# Patient Record
Sex: Female | Born: 1950
Health system: Southern US, Community
[De-identification: ages and names within clinical notes are randomized; demographics above are authoritative.]

## PROBLEM LIST (undated history)

## (undated) DIAGNOSIS — IMO0001 Reserved for inherently not codable concepts without codable children: Secondary | ICD-10-CM

## (undated) DIAGNOSIS — J302 Other seasonal allergic rhinitis: Secondary | ICD-10-CM

## (undated) DIAGNOSIS — F419 Anxiety disorder, unspecified: Secondary | ICD-10-CM

## (undated) DIAGNOSIS — G629 Polyneuropathy, unspecified: Secondary | ICD-10-CM

## (undated) DIAGNOSIS — E119 Type 2 diabetes mellitus without complications: Secondary | ICD-10-CM

## (undated) DIAGNOSIS — Z8619 Personal history of other infectious and parasitic diseases: Secondary | ICD-10-CM

## (undated) DIAGNOSIS — L729 Follicular cyst of the skin and subcutaneous tissue, unspecified: Secondary | ICD-10-CM

## (undated) DIAGNOSIS — I10 Essential (primary) hypertension: Secondary | ICD-10-CM

## (undated) DIAGNOSIS — Z794 Long term (current) use of insulin: Secondary | ICD-10-CM

## (undated) DIAGNOSIS — N051 Unspecified nephritic syndrome with focal and segmental glomerular lesions: Secondary | ICD-10-CM

## (undated) DIAGNOSIS — Z98811 Dental restoration status: Secondary | ICD-10-CM

## (undated) DIAGNOSIS — M1990 Unspecified osteoarthritis, unspecified site: Secondary | ICD-10-CM

## (undated) DIAGNOSIS — K219 Gastro-esophageal reflux disease without esophagitis: Secondary | ICD-10-CM

## (undated) DIAGNOSIS — Z8669 Personal history of other diseases of the nervous system and sense organs: Secondary | ICD-10-CM

## (undated) HISTORY — DX: Anxiety disorder, unspecified: F41.9

## (undated) HISTORY — DX: Other seasonal allergic rhinitis: J30.2

## (undated) HISTORY — PX: FOOT SURGERY: SHX648

## (undated) HISTORY — PX: NASAL SEPTUM SURGERY: SHX37

## (undated) HISTORY — DX: Gastro-esophageal reflux disease without esophagitis: K21.9

## (undated) HISTORY — PX: CARPAL TUNNEL RELEASE: SHX101

## (undated) HISTORY — DX: Polyneuropathy, unspecified: G62.9

## (undated) HISTORY — PX: TONSILLECTOMY: SUR1361

## (undated) HISTORY — DX: Unspecified nephritic syndrome with focal and segmental glomerular lesions: N05.1

## (undated) HISTORY — PX: ABDOMINAL HYSTERECTOMY: SHX81

---

## 1999-05-02 ENCOUNTER — Other Ambulatory Visit: Admission: RE | Admit: 1999-05-02 | Discharge: 1999-05-02 | Payer: Self-pay | Admitting: *Deleted

## 1999-06-07 ENCOUNTER — Encounter: Payer: Self-pay | Admitting: *Deleted

## 1999-06-08 ENCOUNTER — Inpatient Hospital Stay (HOSPITAL_COMMUNITY): Admission: RE | Admit: 1999-06-08 | Discharge: 1999-06-10 | Payer: Self-pay | Admitting: *Deleted

## 1999-06-08 ENCOUNTER — Encounter (INDEPENDENT_AMBULATORY_CARE_PROVIDER_SITE_OTHER): Payer: Self-pay | Admitting: Specialist

## 2000-01-09 ENCOUNTER — Encounter: Admission: RE | Admit: 2000-01-09 | Discharge: 2000-01-09 | Payer: Self-pay | Admitting: Family Medicine

## 2000-01-09 ENCOUNTER — Encounter: Payer: Self-pay | Admitting: Family Medicine

## 2001-04-03 ENCOUNTER — Other Ambulatory Visit: Admission: RE | Admit: 2001-04-03 | Discharge: 2001-04-03 | Payer: Self-pay | Admitting: *Deleted

## 2002-03-25 ENCOUNTER — Encounter: Admission: RE | Admit: 2002-03-25 | Discharge: 2002-03-25 | Payer: Self-pay | Admitting: Nephrology

## 2002-03-25 ENCOUNTER — Encounter: Payer: Self-pay | Admitting: Nephrology

## 2002-03-28 ENCOUNTER — Encounter (INDEPENDENT_AMBULATORY_CARE_PROVIDER_SITE_OTHER): Payer: Self-pay | Admitting: Specialist

## 2002-03-28 ENCOUNTER — Encounter: Payer: Self-pay | Admitting: Nephrology

## 2002-03-28 ENCOUNTER — Ambulatory Visit (HOSPITAL_COMMUNITY): Admission: RE | Admit: 2002-03-28 | Discharge: 2002-03-28 | Payer: Self-pay | Admitting: Nephrology

## 2002-08-06 ENCOUNTER — Other Ambulatory Visit: Admission: RE | Admit: 2002-08-06 | Discharge: 2002-08-06 | Payer: Self-pay | Admitting: *Deleted

## 2003-03-17 ENCOUNTER — Encounter (INDEPENDENT_AMBULATORY_CARE_PROVIDER_SITE_OTHER): Payer: Self-pay | Admitting: *Deleted

## 2003-03-17 ENCOUNTER — Ambulatory Visit (HOSPITAL_COMMUNITY): Admission: RE | Admit: 2003-03-17 | Discharge: 2003-03-17 | Payer: Self-pay | Admitting: *Deleted

## 2003-05-13 ENCOUNTER — Encounter: Payer: Self-pay | Admitting: *Deleted

## 2003-05-13 ENCOUNTER — Encounter: Admission: RE | Admit: 2003-05-13 | Discharge: 2003-05-13 | Payer: Self-pay | Admitting: *Deleted

## 2003-05-25 ENCOUNTER — Encounter: Admission: RE | Admit: 2003-05-25 | Discharge: 2003-05-25 | Payer: Self-pay

## 2003-10-05 ENCOUNTER — Other Ambulatory Visit: Admission: RE | Admit: 2003-10-05 | Discharge: 2003-10-05 | Payer: Self-pay | Admitting: *Deleted

## 2004-06-03 ENCOUNTER — Encounter: Admission: RE | Admit: 2004-06-03 | Discharge: 2004-06-03 | Payer: Self-pay | Admitting: Internal Medicine

## 2004-11-03 ENCOUNTER — Other Ambulatory Visit: Admission: RE | Admit: 2004-11-03 | Discharge: 2004-11-03 | Payer: Self-pay | Admitting: *Deleted

## 2006-02-22 ENCOUNTER — Other Ambulatory Visit: Admission: RE | Admit: 2006-02-22 | Discharge: 2006-02-22 | Payer: Self-pay | Admitting: *Deleted

## 2006-05-17 ENCOUNTER — Ambulatory Visit (HOSPITAL_COMMUNITY): Payer: Self-pay | Admitting: *Deleted

## 2006-07-26 ENCOUNTER — Ambulatory Visit (HOSPITAL_COMMUNITY): Payer: Self-pay | Admitting: *Deleted

## 2007-06-12 ENCOUNTER — Other Ambulatory Visit: Admission: RE | Admit: 2007-06-12 | Discharge: 2007-06-12 | Payer: Self-pay | Admitting: *Deleted

## 2008-11-04 ENCOUNTER — Other Ambulatory Visit: Admission: RE | Admit: 2008-11-04 | Discharge: 2008-11-04 | Payer: Self-pay | Admitting: Gynecology

## 2009-03-24 ENCOUNTER — Ambulatory Visit: Payer: Self-pay

## 2013-03-23 ENCOUNTER — Emergency Department (HOSPITAL_COMMUNITY)
Admission: EM | Admit: 2013-03-23 | Discharge: 2013-03-23 | Disposition: A | Discharge status: Home / Self Care | Payer: Medicare Other | Attending: Emergency Medicine | Admitting: Emergency Medicine

## 2013-03-23 ENCOUNTER — Encounter (HOSPITAL_COMMUNITY): Payer: Self-pay | Admitting: *Deleted

## 2013-03-23 DIAGNOSIS — E119 Type 2 diabetes mellitus without complications: Secondary | ICD-10-CM | POA: Insufficient documentation

## 2013-03-23 DIAGNOSIS — W540XXA Bitten by dog, initial encounter: Secondary | ICD-10-CM

## 2013-03-23 DIAGNOSIS — Y92009 Unspecified place in unspecified non-institutional (private) residence as the place of occurrence of the external cause: Secondary | ICD-10-CM | POA: Insufficient documentation

## 2013-03-23 DIAGNOSIS — Z794 Long term (current) use of insulin: Secondary | ICD-10-CM | POA: Insufficient documentation

## 2013-03-23 DIAGNOSIS — Y9389 Activity, other specified: Secondary | ICD-10-CM | POA: Insufficient documentation

## 2013-03-23 DIAGNOSIS — N289 Disorder of kidney and ureter, unspecified: Secondary | ICD-10-CM | POA: Insufficient documentation

## 2013-03-23 DIAGNOSIS — Z7982 Long term (current) use of aspirin: Secondary | ICD-10-CM | POA: Insufficient documentation

## 2013-03-23 DIAGNOSIS — Z79899 Other long term (current) drug therapy: Secondary | ICD-10-CM | POA: Insufficient documentation

## 2013-03-23 DIAGNOSIS — S81809A Unspecified open wound, unspecified lower leg, initial encounter: Secondary | ICD-10-CM | POA: Insufficient documentation

## 2013-03-23 DIAGNOSIS — I10 Essential (primary) hypertension: Secondary | ICD-10-CM | POA: Insufficient documentation

## 2013-03-23 HISTORY — DX: Essential (primary) hypertension: I10

## 2013-03-23 MED ORDER — HYDROCODONE-ACETAMINOPHEN 5-325 MG PO TABS
1.0000 | ORAL_TABLET | Freq: Once | ORAL | Status: AC
  Administered 2013-03-23: 1 via ORAL
  Filled 2013-03-23: qty 1

## 2013-03-23 MED ORDER — AMOXICILLIN-POT CLAVULANATE 875-125 MG PO TABS: 1.0000 | ORAL_TABLET | Freq: Two times a day (BID) | ORAL | Status: DC

## 2013-03-23 MED ORDER — HYDROCODONE-ACETAMINOPHEN 5-325 MG PO TABS: 1.0000 | ORAL_TABLET | ORAL | Status: DC | PRN

## 2013-03-23 NOTE — ED Notes (Signed)
Pt attacked by friends dog, scratches and puncture mark on left leg. Also sts pain behind right knee.

## 2013-03-23 NOTE — ED Provider Notes (Signed)
History     CSN: [id number]  Arrival date & time 03/23/13  1334   First MD Initiated Contact with Patient 03/23/13 1415      No chief complaint on file.   (Consider location/radiation/quality/duration/timing/severity/associated sxs/prior treatment) Patient is a 62 y.o. female presenting with animal bite. The history is provided by the patient.  Animal Bite  The incident occurred today. The incident occurred at another residence. She came to the ER via personal transport. There is an injury to the left lower leg and right lower leg. Pertinent negatives include no numbness. Associated symptoms comments: Visiting a friend with a new shelter pet who attacked patient and another family friend. .    Past Medical History  Diagnosis Date  . Hypertension   . Diabetes mellitus without complication   . Renal disorder     Past Surgical History  Procedure Laterality Date  . Abdominal hysterectomy    . Carpal tunnel release      No family history on file.  History  Substance Use Topics  . Smoking status: Never Smoker   . Smokeless tobacco: Not on file  . Alcohol Use: No    OB History   Grav Para Term Preterm Abortions TAB SAB Ect Mult Living                  Review of Systems  Musculoskeletal: Positive for myalgias.  Skin: Positive for wound.  Neurological: Negative for numbness.    Allergies  Review of patient's allergies indicates not on file.  Home Medications  No current outpatient prescriptions on file.  BP 153/76  Pulse 74  Temp(Src) 97.9 F (36.6 C)  Resp 16  SpO2 96%  Physical Exam  Constitutional: She is oriented to person, place, and time. She appears well-developed and well-nourished. No distress.  Musculoskeletal:  Linear abrasion to left posterior lower leg with minimal swelling. Swelling with minor bruising posterior right knee wtihout laceration, puncture or abrasion.  Neurological: She is alert and oriented to person, place, and time.    ED  Course  Procedures (including critical care time)  Labs Reviewed - No data to display No results found.   No diagnosis found.  1. Dog bite   MDM  Wounds fairly superficial without need for further intervention. Care instructions given.Tetanus is believed to be up-to-date but patient prefers to check with PCP.         Arnoldo Hooker, PA-C 03/23/13 1503

## 2013-03-23 NOTE — ED Provider Notes (Signed)
Medical screening examination/treatment/procedure(s) were performed by non-physician practitioner and as supervising physician I was immediately available for consultation/collaboration.  Lyanne Co, MD 03/23/13 [phone]

## 2015-02-11 ENCOUNTER — Telehealth: Payer: Self-pay | Admitting: Cardiovascular Disease

## 2015-02-11 NOTE — Telephone Encounter (Signed)
Patient's call was regarding her husband, Sonia Side.  See telephone encounter in his chart

## 2015-02-11 NOTE — Telephone Encounter (Signed)
New message        Pt would like for Sharyn Lull to call her back

## 2015-02-16 ENCOUNTER — Ambulatory Visit: Payer: Medicare Other | Attending: Orthopaedic Surgery | Admitting: Physical Therapy

## 2015-02-16 DIAGNOSIS — E119 Type 2 diabetes mellitus without complications: Secondary | ICD-10-CM | POA: Diagnosis not present

## 2015-02-16 DIAGNOSIS — I10 Essential (primary) hypertension: Secondary | ICD-10-CM | POA: Diagnosis not present

## 2015-02-16 DIAGNOSIS — M25562 Pain in left knee: Secondary | ICD-10-CM | POA: Insufficient documentation

## 2015-02-16 DIAGNOSIS — M79651 Pain in right thigh: Secondary | ICD-10-CM | POA: Diagnosis not present

## 2015-02-16 DIAGNOSIS — M6281 Muscle weakness (generalized): Secondary | ICD-10-CM | POA: Diagnosis not present

## 2015-02-16 DIAGNOSIS — Z9181 History of falling: Secondary | ICD-10-CM | POA: Diagnosis not present

## 2015-02-16 DIAGNOSIS — S76301A Unspecified injury of muscle, fascia and tendon of the posterior muscle group at thigh level, right thigh, initial encounter: Secondary | ICD-10-CM

## 2015-02-16 NOTE — Patient Instructions (Signed)
Hamstring Step 1   Straighten left knee. Keep knee level with other knee or on bolster. Hold 30 seconds. Relax knee by returning foot to start. Repeat  4 times a day.  Copyright  VHI. All rights reserved.   Straight Leg Raise: With External Leg Rotation   Lie on back with right leg straight, opposite leg bent. Rotate straight leg out and lift 8 inches. Repeat 10 times per set. Do 2 sets per session. Do 1 Quads / HF, Supine   Lie near edge of bed, one leg bent, foot flat on bed. Other leg hanging over edge, relaxed, thigh resting entirely on bed. Bend hanging knee backward keeping thigh in contact with bed. Hold 30 seconds.  Repeat 3 times per session. Do 1 sessions per day.  Copyright  VHI. All rights reserved.  sessions per day.  http://orth.exer.us/729   Copyright  VHI. All rights reserved.   Starr Lake PT, DPT, LAT, ATC  [facility] Phone: [phone]

## 2015-02-16 NOTE — Therapy (Addendum)
[place], [place], [address] Phone: [phone]   Fax:  [phone]  Physical Therapy Evaluation  Patient Details  Name: Paula Richard MRN: [id number] Date of Birth: 04-20-1951 Referring Provider:  Leandrew Koyanagi, MD  Encounter Date: 02/16/2015      PT End of Session - 02/16/15 1231    Visit Number 1   Number of Visits 12   Date for PT Re-Evaluation 03/30/15   PT Start Time 2248   PT Stop Time 1230   PT Time Calculation (min) 45 min   Activity Tolerance Patient tolerated treatment well   Behavior During Therapy [facility] for tasks assessed/performed      Past Medical History  Diagnosis Date  . Hypertension   . Diabetes mellitus without complication   . Renal disorder     Past Surgical History  Procedure Laterality Date  . Abdominal hysterectomy    . Carpal tunnel release      There were no vitals filed for this visit.  Visit Diagnosis:  Left knee pain - Plan: PT plan of care cert/re-cert  Hamstring injury, right, initial encounter - Plan: PT plan of care cert/re-cert  Risk for falls - Plan: PT plan of care cert/re-cert  Generalized muscle weakness - Plan: PT plan of care cert/re-cert      Subjective Assessment - 02/16/15 1152    Symptoms pt is a 64 y.o. F with CC L knee pain and R hamstring pain. Difficulty with squatting and popping and clicking. Pt had an injection in the knee 3 weeks. R hamstring pain has been going on for the last few years and has gotten worse in the last few months.    Limitations Standing;Walking;Lifting;Other (comment);House hold activities  stairs   How long can you sit comfortably? 20 minutes   How long can you stand comfortably? 15 minutes   How long can you walk comfortably? 30 minutes   Diagnostic tests MRI November in 2015 pt reports that she couldn't find anything   Patient Stated Goals to be able to exercise without pain and to feel comfortable with  exercise and do more around the house.   Currently in Pain? Yes   Pain Score 4    Pain Location Knee   Pain Orientation Left;Mid   Pain Descriptors / Indicators Aching   Pain Type Chronic pain   Pain Onset More than a month ago   Pain Frequency Constant   Aggravating Factors  Squating to clean, climbing/descending steps,    Pain Relieving Factors lay down, ultracet   Effect of Pain on Daily Activities limited squatting   Multiple Pain Sites Yes   Pain Score 8   Pain Type Chronic pain   Pain Location Knee   Pain Orientation Posterior;Right;Proximal   Pain Descriptors / Indicators Aching   Pain Frequency Constant   Pain Onset On-going            [facility] PT Assessment - 02/16/15 1201    Assessment   Medical Diagnosis left knee pain   Onset Date --  Chronic since 2009   Next MD Visit --  a week or two per pt report   Prior Therapy yes  on back   Precautions   Precautions None   Restrictions   Weight Bearing Restrictions No   Balance Screen   Has the patient fallen in the past 6 months Yes   How many times? 2   Has the patient had a decrease  in activity level because of a fear of falling?  Yes   Is the patient reluctant to leave their home because of a fear of falling?  Yes   [place] Private residence   Living Arrangements Spouse/significant other   Available Help at Discharge Available 24 hours/day   Type of [place] to enter;Ramped entrance  steps on back ramp on the front   Entrance Stairs-Number of Steps 6   Entrance Stairs-Rails Can reach both   Home Layout One level   [place] - single point;Walker - 2 wheels   Prior Function   Level of Independence Independent with basic ADLs;Independent with homemaking with ambulation;Independent with gait;Independent with transfers   Observation/Other Assessments   Focus on Therapeutic Outcomes (FOTO)  57% limitation  projected to increase to 46% limitation    Posture/Postural Control   Posture/Postural Control Postural limitations   Postural Limitations Rounded Shoulders;Forward head   ROM / Strength   AROM / PROM / Strength AROM;PROM;Strength   AROM   AROM Assessment Site Knee   Right/Left Knee Right;Left   Right Knee Extension 1   Right Knee Flexion 136  tightness in hamstring and ant knee   Left Knee Extension 0   Left Knee Flexion 136   PROM   PROM Assessment Site Knee   Right/Left Knee Right;Left   Strength   Strength Assessment Site Knee   Right/Left Knee Right;Left   Right Knee Flexion 4-/5   Right Knee Extension 4-/5   Left Knee Flexion 3+/5   Left Knee Extension 3+/5   Flexibility   Soft Tissue Assessment /Muscle Length yes   Hamstrings 26 on the R, 10 on L actively and 26 passively   Palpation   Palpation tenderness at the proximal hamstring and ischial tuberosity    Balance   Balance Assessed Yes   Static Standing Balance   Static Standing Balance -  Activities  Single Leg Stance - Right Leg;Single Leg Stance - Left Leg  R 15, L 10 sec with mod postural sway.                            PT Education - 02/16/15 1231    Education provided Yes   Education Details evaluation findings, POC, goals, HEP   Person(s) Educated Patient   Methods Explanation   Comprehension Verbalized understanding          PT Short Term Goals - 02/16/15 1247    PT SHORT TERM GOAL #1   Title pt will be I with Basic HEP (02/06/2015)   Time 3   Period Weeks   Status New   PT SHORT TERM GOAL #2   Title pt will decrease pain to < 4/10 during and following prolonged walking/ standing activities to assist with exercise progression (02/06/2015)   Time 3   Period Weeks   Status New   PT SHORT TERM GOAL #3   Title pt will increase bil knee to >4-/5 bil to assist with prolonged walking/ standing and safety during ambulation (02/06/2015)   Time 3   Period Weeks   Status New           PT Long Term Goals - 02/16/15  1251    PT LONG TERM GOAL #1   Title Pt will be I with advanced HEP (03/30/2015)   Time 6   Period Weeks   Status New  PT LONG TERM GOAL #2   Title pt will decrease pain to <2/10 during functional squatting and sit to stand activities to assist with house chores and ADLS (4/0/9811)   Time 6   Period Weeks   Status New   PT LONG TERM GOAL #3   Title pt will be able to ascending/ descend >12 steps with a step over pattern <2/10 pain to assist with safety and funcitonal mobility (03/30/2015)   Time 6   Period Weeks   Status New   PT LONG TERM GOAL #4   Title pt will increase FOTO score by >11 points to assist with functional capacity (03/30/2015)   Time 6   Period Weeks   Status New   PT LONG TERM GOAL #5   Title pt will verbalize and demonstrate techniques to reduce risk of reinjury via postural awareness, squatting/kneeling mechanics, pain control, safety during ambulation and HEP   Time 6   Period Weeks   Status New          FOTO  57% limited Gcode: Mobility walking and moving around Current status: CK 40-60% Goals status: CJ 20-40%      Plan - 02/16/15 1232    Clinical Impression Statement Marchel presents to OPPT with CC of Left knee pain and R hamstring tightness /pain. Bil knee AROM is WNL with pain noted at end range knee flexion bil with hamstring pain on the R with weakness of Bil knees with L>R. she demonstrates significant hamstring soft tissue restriction bil with L>R. She is tender at the R medial tibial plateau  and femoral conydle and the L proxmial hamstring at the ischial tuberosity. pt history and evaluation findings are consistent with L patellofemoral pathology.   She has a hx of falls with 2 in the last 6 months and demonstrates limited balance with SLS BIL with Moderate postural sway demonstrating a mod-high fall risk. She would benefit from skilled physical therapy to maximize her funcitonal capacity and increase safety with ambulation by addressing the  defecits listed.    Pt will benefit from skilled therapeutic intervention in order to improve on the following deficits Impaired flexibility;Postural dysfunction;Decreased endurance;Decreased activity tolerance;Decreased balance;Pain;Decreased strength;Decreased mobility;Difficulty walking;Decreased range of motion;Increased muscle spasms;Decreased safety awareness   Rehab Potential Good   PT Frequency 2x / week   PT Duration 6 weeks   PT Treatment/Interventions ADLs/Self Care Home Management;Moist Heat;Therapeutic activities;Patient/family education;Passive range of motion;Therapeutic exercise;Manual techniques;Balance training;Gait training;Ultrasound;Neuromuscular re-education;Electrical Stimulation;Stair training   PT Next Visit Plan asses response to HEP, modalities for pain, stretching and strengthening of knee/hip musculature, balance training   PT Home Exercise Plan Hamstring stretch, SLR with external rotation, hip flexor stretch.    Consulted and Agree with Plan of Care Patient         Problem List There are no active problems to display for this patient.  Starr Lake PT, DPT, LAT, ATC  02/16/2015  1:02 PM   [facility] [address] [place], [place], [address] Phone: [phone]   Fax:  [phone]    PHYSICAL THERAPY DISCHARGE SUMMARY  Visits from Start of Care: 1  Current functional level related to goals / functional outcomes: FOTO: 57% limitation   Remaining deficits: Left knee/ hamstring pain and limited AROM/PROM, fall risk.   Education / Equipment: Initial HEP handout  Plan: Patient agrees to discharge.  Patient goals were not met. Patient is being discharged due to financial reasons.  ?????    [employer]  PT, DPT, LAT, ATC  03/01/2015  5:24 PM

## 2015-02-26 ENCOUNTER — Telehealth: Payer: Self-pay | Admitting: *Deleted

## 2015-02-26 NOTE — Telephone Encounter (Signed)
pt discharged herself because she can not afford the copay.Marland KitchenMarland KitchenTD

## 2015-03-02 ENCOUNTER — Ambulatory Visit: Payer: No Typology Code available for payment source | Admitting: Physical Therapy

## 2015-03-04 ENCOUNTER — Encounter: Payer: Self-pay | Admitting: Physical Therapy

## 2015-03-09 ENCOUNTER — Encounter: Payer: Self-pay | Admitting: Physical Therapy

## 2015-03-11 ENCOUNTER — Encounter: Payer: Self-pay | Admitting: Physical Therapy

## 2015-03-16 ENCOUNTER — Encounter: Payer: Self-pay | Admitting: Physical Therapy

## 2015-03-18 ENCOUNTER — Encounter: Payer: Self-pay | Admitting: Physical Therapy

## 2015-03-23 ENCOUNTER — Encounter: Payer: Self-pay | Admitting: Physical Therapy

## 2015-03-25 ENCOUNTER — Encounter: Payer: Self-pay | Admitting: Physical Therapy

## 2015-04-14 ENCOUNTER — Encounter: Payer: Self-pay | Admitting: Cardiology

## 2015-04-14 ENCOUNTER — Ambulatory Visit (INDEPENDENT_AMBULATORY_CARE_PROVIDER_SITE_OTHER): Payer: Medicare Other | Admitting: Cardiology

## 2015-04-14 VITALS — BP 130/80 | HR 68 | Ht 63.0 in | Wt 158.0 lb

## 2015-04-14 DIAGNOSIS — E119 Type 2 diabetes mellitus without complications: Secondary | ICD-10-CM

## 2015-04-14 DIAGNOSIS — R0789 Other chest pain: Secondary | ICD-10-CM | POA: Insufficient documentation

## 2015-04-14 DIAGNOSIS — E785 Hyperlipidemia, unspecified: Secondary | ICD-10-CM

## 2015-04-14 DIAGNOSIS — I10 Essential (primary) hypertension: Secondary | ICD-10-CM

## 2015-04-14 DIAGNOSIS — R002 Palpitations: Secondary | ICD-10-CM

## 2015-04-14 DIAGNOSIS — R1013 Epigastric pain: Secondary | ICD-10-CM

## 2015-04-14 NOTE — Progress Notes (Signed)
Cardiology Office Note   Date:  04/14/2015   ID:  Paula Richard, DOB 02-03-1951, MRN [id number]  PCP:  Velna Hatchet, MD  Cardiologist:   Candee Furbish, MD       History of Present Illness: Paula Richard is a 64 y.o. female who presents for her for evaluation of abnormal EKG. An EKG was performed on 04/13/15 at [facility] which demonstrated sinus rhythm heart rate of 66 bpm with T-wave inversion in V2 through V5. Possible ischemia. No prior comparison. Her husband sees Dr. Cathie Olden.  Mild chest pain, epigastric, non exertional. No radiation of symptoms. Seemingly no exacerbating factors. No SOB. Does not seem to change with food.  Currently has comorbidities of DM, hypertension, hyperlipidemia.  She had about several years ago of FSGS. Prior echocardiogram was normal.     Past Medical History  Diagnosis Date  . Hypertension   . Diabetes mellitus without complication   . Renal disorder   . Neuropathy   . FSGS (focal segmental glomerulosclerosis)   . GERD (gastroesophageal reflux disease)   . Anxiety   . Seasonal allergies     Past Surgical History  Procedure Laterality Date  . Abdominal hysterectomy    . Carpal tunnel release       Current Outpatient Prescriptions  Medication Sig Dispense Refill  . aspirin 81 MG tablet Take 81 mg by mouth daily.    Marland Kitchen atorvastatin (LIPITOR) 40 MG tablet Take 40 mg by mouth daily.    . diazepam (VALIUM) 5 MG tablet Take 5 mg by mouth every 6 (six) hours as needed for anxiety.    . Estradiol (ESTRACE PO) Take by mouth daily.    Marland Kitchen Fexofenadine HCl (ALLEGRA PO) Take by mouth daily.    Marland Kitchen gabapentin (NEURONTIN) 300 MG capsule Take 300 mg by mouth QID.    Marland Kitchen insulin aspart (NOVOLOG) 100 UNIT/ML injection Inject 10 Units into the skin 3 (three) times daily with meals.     Marland Kitchen LEVEMIR FLEXTOUCH 100 UNIT/ML Pen 50 Units at bedtime.  6  . metFORMIN (GLUCOPHAGE) 500 MG tablet Take 500 mg by mouth 3 (three) times daily before  meals.    . propranolol (INDERAL) 80 MG tablet Take 80 mg by mouth 2 (two) times daily.    Marland Kitchen telmisartan-hydrochlorothiazide (MICARDIS HCT) 40-12.5 MG per tablet Take 1 tablet by mouth daily.    Marland Kitchen venlafaxine (EFFEXOR) 75 MG tablet Take 75 mg by mouth 2 (two) times daily.     No current facility-administered medications for this visit.    Allergies:   Review of patient's allergies indicates no known allergies.    Social History:  The patient  reports that she has never smoked. She does not have any smokeless tobacco history on file. She reports that she does not drink alcohol. favorite color purple.  Family History:  The patient's Father died of MI when she was 61. Had HTN. Mother was smoker, stroke.     ROS:  Please see the history of present illness.   Otherwise, review of systems are positive for dizziness, skipped heartbeats, excessive fatigue.   All other systems are reviewed and negative.    PHYSICAL EXAM: VS:  BP 130/80 mmHg  Pulse 68  Ht 5\' 3"  (1.6 m)  Wt 158 lb (71.668 kg)  BMI 28.00 kg/m2 , BMI Body mass index is 28 kg/(m^2). GEN: Well nourished, well developed, in no acute distress HEENT: normal Neck: no JVD, carotid bruits, or masses Cardiac: RRR;  no murmurs, rubs, or gallops,no edema  Respiratory:  clear to auscultation bilaterally, normal work of breathing GI: soft, nontender, nondistended, + BS MS: no deformity or atrophy Skin: warm and dry, no rash Neuro:  Strength and sensation are intact Psych: euthymic mood, full affect   EKG:  EKG is not ordered today. The ekg ordered previously demonstrates T-wave inversion in precordial leads, possible ischemia   Recent Labs: No results found for requested labs within last 365 days.    Lipid Panel No results found for: CHOL, TRIG, HDL, CHOLHDL, VLDL, LDLCALC, LDLDIRECT    Wt Readings from Last 3 Encounters:  04/14/15 158 lb (71.668 kg)      Other studies Reviewed: Additional studies/ records that were  reviewed today include: Prior office visits, records reviewed, EKG reviewed, hemoglobin A1c 6.5.. Review of the above records demonstrates: as above   ASSESSMENT AND PLAN:  1.  Chest pain/abnormal EKG-fairly atypical chest discomfort, nonexertional, epigastric in location certainly could be musculoskeletal/GI however given her EKG abnormality, I will proceed with pharmacologic stress test ensure that there is no evidence of ischemia. She does have risks of diabetes, hypertension, hyperlipidemia. Could also be musculoskeletal with former diagnosis of fibromyalgia.  2. Diabetes with prior renal manifestations-history of glomerular nephritis. Former patient of Dr. Hassell Done. Hemoglobin A1c 6.5. Excellent control. On injectable.  3. Hyperlipidemia-continue with atorvastatin. Excellent.  4. Essential hypertension-currently well controlled. Angiotensin receptor blocker.  5. Palpitations-currently well controlled on Inderal. She has been on this for years.   Current medicines are reviewed at length with the patient today.  The patient does not have concerns regarding medicines.  The following changes have been made:  Stress test has been ordered.  Labs/ tests ordered today include:  No orders of the defined types were placed in this encounter.     Disposition:   FU with with results of stress test.    Bobby Rumpf, MD  04/14/2015 10:24 AM    [facility] [place], [place], [place]  [address] Phone: [phone]; Fax: [phone]

## 2015-04-14 NOTE — Patient Instructions (Signed)
Medication Instructions:  Your physician recommends that you continue on your current medications as directed. Please refer to the Current Medication list given to you today.  Testing/Procedures: Your physician has requested that you have a lexiscan myoview. For further information please visit HugeFiesta.tn. Please follow instruction sheet, as given.  Follow-Up: As needed with Dr Marlou Porch based on the results of your testing.  Thank you for choosing [facility]!!

## 2015-04-27 ENCOUNTER — Encounter: Payer: Self-pay | Admitting: Internal Medicine

## 2015-04-27 ENCOUNTER — Telehealth (HOSPITAL_COMMUNITY): Payer: Self-pay | Admitting: *Deleted

## 2015-04-27 NOTE — Telephone Encounter (Signed)
Patient given detailed instructions per Myocardial Perfusion Study Information Sheet for test on 04/28/15 at 0700. Patient verbalized understanding. Paula Richard, Ranae Palms

## 2015-04-28 ENCOUNTER — Ambulatory Visit (HOSPITAL_COMMUNITY): Payer: Medicare Other | Attending: Cardiovascular Disease

## 2015-04-28 DIAGNOSIS — R1013 Epigastric pain: Secondary | ICD-10-CM

## 2015-04-28 LAB — MYOCARDIAL PERFUSION IMAGING
LV dias vol: 66 mL
LV sys vol: 20 mL
Nuc Stress EF: 70 %
Peak HR: 83 {beats}/min
RATE: 0.34
Rest HR: 60 {beats}/min
SDS: 1
SRS: 1
SSS: 2
TID: 0.98

## 2015-04-28 MED ORDER — TECHNETIUM TC 99M SESTAMIBI GENERIC - CARDIOLITE
11.0000 | Freq: Once | INTRAVENOUS | Status: AC | PRN
  Administered 2015-04-28: 11 via INTRAVENOUS

## 2015-04-28 MED ORDER — TECHNETIUM TC 99M SESTAMIBI GENERIC - CARDIOLITE
33.0000 | Freq: Once | INTRAVENOUS | Status: AC | PRN
  Administered 2015-04-28: 33 via INTRAVENOUS

## 2015-04-28 MED ORDER — REGADENOSON 0.4 MG/5ML IV SOLN
0.4000 mg | Freq: Once | INTRAVENOUS | Status: AC
  Administered 2015-04-28: 0.4 mg via INTRAVENOUS

## 2015-04-29 ENCOUNTER — Encounter (HOSPITAL_COMMUNITY): Payer: Medicare Other

## 2015-05-04 ENCOUNTER — Telehealth: Payer: Self-pay | Admitting: Cardiology

## 2015-05-04 NOTE — Telephone Encounter (Signed)
error 

## 2015-05-18 ENCOUNTER — Encounter: Payer: Self-pay | Admitting: Internal Medicine

## 2015-06-21 ENCOUNTER — Other Ambulatory Visit: Payer: Self-pay | Admitting: Obstetrics and Gynecology

## 2015-06-22 LAB — CYTOLOGY - PAP

## 2015-06-30 ENCOUNTER — Encounter: Payer: Medicare Other | Admitting: Internal Medicine

## 2015-08-17 ENCOUNTER — Encounter: Payer: Medicare Other | Admitting: Internal Medicine

## 2015-10-20 ENCOUNTER — Other Ambulatory Visit: Payer: Self-pay | Admitting: Internal Medicine

## 2015-10-20 DIAGNOSIS — G510 Bell's palsy: Secondary | ICD-10-CM

## 2015-10-26 ENCOUNTER — Ambulatory Visit
Admission: RE | Admit: 2015-10-26 | Discharge: 2015-10-26 | Disposition: A | Discharge status: Home / Self Care | Payer: Medicare Other | Source: Ambulatory Visit | Attending: Internal Medicine | Admitting: Internal Medicine

## 2015-10-26 DIAGNOSIS — G510 Bell's palsy: Secondary | ICD-10-CM

## 2015-11-06 ENCOUNTER — Other Ambulatory Visit: Payer: Medicare Other

## 2016-07-12 ENCOUNTER — Other Ambulatory Visit: Payer: Self-pay | Admitting: Radiology

## 2016-10-23 ENCOUNTER — Ambulatory Visit (INDEPENDENT_AMBULATORY_CARE_PROVIDER_SITE_OTHER): Payer: Medicare Other | Admitting: Orthopaedic Surgery

## 2016-10-23 ENCOUNTER — Encounter (INDEPENDENT_AMBULATORY_CARE_PROVIDER_SITE_OTHER): Payer: Self-pay | Admitting: Orthopaedic Surgery

## 2016-10-23 DIAGNOSIS — M65341 Trigger finger, right ring finger: Secondary | ICD-10-CM | POA: Insufficient documentation

## 2016-10-23 MED ORDER — BUPIVACAINE HCL 0.5 % IJ SOLN
0.3300 mL | INTRAMUSCULAR | Status: AC | PRN
  Administered 2016-10-23: .33 mL

## 2016-10-23 MED ORDER — LIDOCAINE HCL 1 % IJ SOLN
0.3000 mL | INTRAMUSCULAR | Status: AC | PRN
  Administered 2016-10-23: .3 mL

## 2016-10-23 MED ORDER — METHYLPREDNISOLONE ACETATE 40 MG/ML IJ SUSP
13.3300 mg | INTRAMUSCULAR | Status: AC | PRN
  Administered 2016-10-23: 13.33 mg

## 2016-10-23 NOTE — Progress Notes (Signed)
   Office Visit Note   Patient: Paula Richard           Date of Birth: 01-Sep-1951           MRN: [id number] Visit Date: 10/23/2016              Requested by: Velna Hatchet, MD [address] [place], [place] [address] PCP: Velna Hatchet, MD   Assessment & Plan: Visit Diagnoses:  1. Trigger finger, right ring finger     Plan: Right ring trigger finger injection was performed today patient tolerated this well. Follow-up as needed.   Follow-Up Instructions: Return if symptoms worsen or fail to improve.   Orders:  Orders Placed This Encounter  Procedures  . Hand/Upper Extremity Injection/Arthrocentesis   No orders of the defined types were placed in this encounter.     Procedures: Hand/UE Inj Date/Time: 10/23/2016 5:48 PM Performed by: Leandrew Koyanagi Authorized by: Leandrew Koyanagi   Consent Given by:  Patient Timeout: prior to procedure the correct patient, procedure, and site was verified   Indications:  Pain Condition: trigger finger   Location: see office note for specific finger. Prep: patient was prepped and draped in usual sterile fashion   Needle Size:  25 G Approach:  [place]olar Medications:  0.3 mL lidocaine 1 %; 0.33 mL bupivacaine 0.5 %; 13.33 mg methylPREDNISolone acetate 40 MG/ML     Clinical Data: No additional findings.   Subjective: Chief Complaint  Patient presents with  . Right Ring Finger - Pain    HPI Patient comes in today for new complaint of right Ring trigger finger. She is doing a lot of repetitive movements with her hand. She has been having 3 weeks of pain. She does have active triggering. She's had an injection of her left thumb in the past with good results. Review of Systems Complete review of systems negative except for history of present illness  Objective: Vital Signs: There were no vitals taken for this visit.  Physical Exam Well-developed well-nourished no acute distress alert and 3 Ortho Exam Exam of the right hand  shows palpable triggering of the A1 pulley in line with the ring finger. There are no skin changes. Otherwise benign exam Specialty Comments:  No specialty comments available.  Imaging: No results found.   PMFS History: Patient Active Problem List   Diagnosis Date Noted  . Trigger finger, right ring finger 10/23/2016  . Other chest pain 04/14/2015  . Type 2 diabetes mellitus without complication ([place]) [id number]  . Hyperlipidemia 04/14/2015  . Essential hypertension 04/14/2015  . Epigastric pain 04/14/2015  . Palpitations 04/14/2015   Past Medical History:  Diagnosis Date  . Anxiety   . Diabetes mellitus without complication ([place])   . FSGS (focal segmental glomerulosclerosis)   . GERD (gastroesophageal reflux disease)   . Hypertension   . Neuropathy ([place])   . Renal disorder   . Seasonal allergies     No family history on file.  Past Surgical History:  Procedure Laterality Date  . ABDOMINAL HYSTERECTOMY    . CARPAL TUNNEL RELEASE     Social History   Occupational History  . Not on file.   Social History Main Topics  . Smoking status: Never Smoker  . Smokeless tobacco: Not on file  . Alcohol use No  . Drug use: Unknown  . Sexual activity: Not on file

## 2017-01-08 DIAGNOSIS — Z6827 Body mass index (BMI) 27.0-27.9, adult: Secondary | ICD-10-CM | POA: Diagnosis not present

## 2017-01-08 DIAGNOSIS — Z794 Long term (current) use of insulin: Secondary | ICD-10-CM | POA: Diagnosis not present

## 2017-01-08 DIAGNOSIS — E1165 Type 2 diabetes mellitus with hyperglycemia: Secondary | ICD-10-CM | POA: Diagnosis not present

## 2017-01-08 DIAGNOSIS — E119 Type 2 diabetes mellitus without complications: Secondary | ICD-10-CM | POA: Diagnosis not present

## 2017-01-08 DIAGNOSIS — K219 Gastro-esophageal reflux disease without esophagitis: Secondary | ICD-10-CM | POA: Diagnosis not present

## 2017-01-08 DIAGNOSIS — F419 Anxiety disorder, unspecified: Secondary | ICD-10-CM | POA: Diagnosis not present

## 2017-01-08 DIAGNOSIS — I10 Essential (primary) hypertension: Secondary | ICD-10-CM | POA: Diagnosis not present

## 2017-04-09 DIAGNOSIS — Z1389 Encounter for screening for other disorder: Secondary | ICD-10-CM | POA: Diagnosis not present

## 2017-04-09 DIAGNOSIS — I10 Essential (primary) hypertension: Secondary | ICD-10-CM | POA: Diagnosis not present

## 2017-04-09 DIAGNOSIS — I8390 Asymptomatic varicose veins of unspecified lower extremity: Secondary | ICD-10-CM | POA: Diagnosis not present

## 2017-04-09 DIAGNOSIS — Z6828 Body mass index (BMI) 28.0-28.9, adult: Secondary | ICD-10-CM | POA: Diagnosis not present

## 2017-04-09 DIAGNOSIS — E1165 Type 2 diabetes mellitus with hyperglycemia: Secondary | ICD-10-CM | POA: Diagnosis not present

## 2017-04-09 DIAGNOSIS — Z794 Long term (current) use of insulin: Secondary | ICD-10-CM | POA: Diagnosis not present

## 2017-04-09 DIAGNOSIS — B029 Zoster without complications: Secondary | ICD-10-CM | POA: Diagnosis not present

## 2017-04-09 DIAGNOSIS — M797 Fibromyalgia: Secondary | ICD-10-CM | POA: Diagnosis not present

## 2017-04-09 DIAGNOSIS — E785 Hyperlipidemia, unspecified: Secondary | ICD-10-CM | POA: Diagnosis not present

## 2017-05-08 ENCOUNTER — Ambulatory Visit (INDEPENDENT_AMBULATORY_CARE_PROVIDER_SITE_OTHER): Payer: PRIVATE HEALTH INSURANCE

## 2017-05-08 ENCOUNTER — Ambulatory Visit (INDEPENDENT_AMBULATORY_CARE_PROVIDER_SITE_OTHER): Payer: PRIVATE HEALTH INSURANCE | Admitting: Orthopaedic Surgery

## 2017-05-08 DIAGNOSIS — M1712 Unilateral primary osteoarthritis, left knee: Secondary | ICD-10-CM

## 2017-05-08 DIAGNOSIS — M65342 Trigger finger, left ring finger: Secondary | ICD-10-CM | POA: Diagnosis not present

## 2017-05-08 DIAGNOSIS — M65341 Trigger finger, right ring finger: Secondary | ICD-10-CM | POA: Diagnosis not present

## 2017-05-08 MED ORDER — LIDOCAINE HCL 1 % IJ SOLN
2.0000 mL | INTRAMUSCULAR | Status: AC | PRN
  Administered 2017-05-08: 2 mL

## 2017-05-08 MED ORDER — BUPIVACAINE HCL 0.5 % IJ SOLN
2.0000 mL | INTRAMUSCULAR | Status: AC | PRN
  Administered 2017-05-08: 2 mL via INTRA_ARTICULAR

## 2017-05-08 MED ORDER — METHYLPREDNISOLONE ACETATE 40 MG/ML IJ SUSP
40.0000 mg | INTRAMUSCULAR | Status: AC | PRN
  Administered 2017-05-08: 40 mg via INTRA_ARTICULAR

## 2017-05-08 NOTE — Progress Notes (Signed)
Office Visit Note   Patient: Paula Richard           Date of Birth: 1951/04/27           MRN: [id number] Visit Date: 05/08/2017              Requested by: Velna Hatchet, MD [address] [place], [place] [address] PCP: Velna Hatchet, MD   Assessment & Plan: Visit Diagnoses:  1. Trigger finger, right ring finger   2. Unilateral primary osteoarthritis, left knee   3. Trigger finger, left ring finger     Plan: For her trigger fingers after much discussion she would like to undergo trigger finger release anterior lysis of the right ring finger and an injection in her left ring finger while she is under anesthesia. For her left knee we did get updated x-rays which shows mild patellofemoral arthritis. The left knee was injected. We will get her scheduled for her trigger finger release and injection in the near future.  Follow-Up Instructions: Return if symptoms worsen or fail to improve.   Orders:  Orders Placed This Encounter  Procedures  . XR KNEE 3 VIEW LEFT   No orders of the defined types were placed in this encounter.     Procedures: Large Joint Inj Date/Time: 05/08/2017 12:39 PM Performed by: Leandrew Koyanagi Authorized by: Leandrew Koyanagi   Consent Given by:  Patient Timeout: prior to procedure the correct patient, procedure, and site was verified   Indications:  Pain Location:  Knee Site:  R knee Prep: patient was prepped and draped in usual sterile fashion   Needle Size:  22 G Ultrasound Guidance: No   Fluoroscopic Guidance: No   Arthrogram: No   Medications:  2 mL lidocaine 1 %; 2 mL bupivacaine 0.5 %; 40 mg methylPREDNISolone acetate 40 MG/ML Patient tolerance:  Patient tolerated the procedure well with no immediate complications     Clinical Data: No additional findings.   Subjective: Chief Complaint  Patient presents with  . Left Hand - Pain  . Right Hand - Pain    Patient comes back today for bilateral ring trigger fingers and worsening of  left knee pain. She is requesting an injection the left knee. She states the previous injections in her left knee and her right trigger finger worked well but this has recurred. She states that the previous trigger finger injection was to painful for her to want to undergo another one.    Review of Systems  Constitutional: Negative.   HENT: Negative.   Eyes: Negative.   Respiratory: Negative.   Cardiovascular: Negative.   Endocrine: Negative.   Musculoskeletal: Negative.   Neurological: Negative.   Hematological: Negative.   Psychiatric/Behavioral: Negative.   All other systems reviewed and are negative.    Objective: Vital Signs: There were no vitals taken for this visit.  Physical Exam  Constitutional: She is oriented to person, place, and time. She appears well-developed and well-nourished.  HENT:  Head: Normocephalic and atraumatic.  Eyes: EOM are normal.  Neck: Neck supple.  Pulmonary/Chest: Effort normal.  Abdominal: Soft.  Neurological: She is alert and oriented to person, place, and time.  Skin: Skin is warm. Capillary refill takes less than 2 seconds.  Psychiatric: She has a normal mood and affect. Her behavior is normal. Judgment and thought content normal.  Nursing note and vitals reviewed.   Ortho Exam Bilateral hand exam is consistent with ring trigger fingers. Left knee exam shows no joint effusion.  Good range of motion. Positive patellar crepitus. Specialty Comments:  No specialty comments available.  Imaging: Xr Knee 3 View Left  Result Date: 05/08/2017 Mild patellofemoral arthritis    PMFS History: Patient Active Problem List   Diagnosis Date Noted  . Unilateral primary osteoarthritis, left knee 05/08/2017  . Trigger finger, left ring finger 05/08/2017  . Trigger finger, right ring finger 10/23/2016  . Other chest pain 04/14/2015  . Type 2 diabetes mellitus without complication ([place]) 35/82/5189  . Hyperlipidemia 04/14/2015  . Essential  hypertension 04/14/2015  . Epigastric pain 04/14/2015  . Palpitations 04/14/2015   Past Medical History:  Diagnosis Date  . Anxiety   . Diabetes mellitus without complication ([place])   . FSGS (focal segmental glomerulosclerosis)   . GERD (gastroesophageal reflux disease)   . Hypertension   . Neuropathy ([place])   . Renal disorder   . Seasonal allergies     No family history on file.  Past Surgical History:  Procedure Laterality Date  . ABDOMINAL HYSTERECTOMY    . CARPAL TUNNEL RELEASE     Social History   Occupational History  . Not on file.   Social History Main Topics  . Smoking status: Never Smoker  . Smokeless tobacco: Not on file  . Alcohol use No  . Drug use: Unknown  . Sexual activity: Not on file

## 2017-05-21 ENCOUNTER — Other Ambulatory Visit (INDEPENDENT_AMBULATORY_CARE_PROVIDER_SITE_OTHER): Payer: Self-pay | Admitting: Orthopaedic Surgery

## 2017-05-21 DIAGNOSIS — M65341 Trigger finger, right ring finger: Secondary | ICD-10-CM

## 2017-05-22 ENCOUNTER — Encounter (HOSPITAL_BASED_OUTPATIENT_CLINIC_OR_DEPARTMENT_OTHER): Payer: Self-pay | Admitting: *Deleted

## 2017-05-22 ENCOUNTER — Encounter (HOSPITAL_BASED_OUTPATIENT_CLINIC_OR_DEPARTMENT_OTHER)
Admission: RE | Admit: 2017-05-22 | Discharge: 2017-05-22 | Disposition: A | Discharge status: Home / Self Care | Payer: Medicare Other | Source: Ambulatory Visit | Attending: Orthopaedic Surgery | Admitting: Orthopaedic Surgery

## 2017-05-22 DIAGNOSIS — M65342 Trigger finger, left ring finger: Secondary | ICD-10-CM | POA: Diagnosis not present

## 2017-05-22 DIAGNOSIS — M1990 Unspecified osteoarthritis, unspecified site: Secondary | ICD-10-CM | POA: Diagnosis not present

## 2017-05-22 DIAGNOSIS — M65341 Trigger finger, right ring finger: Secondary | ICD-10-CM | POA: Diagnosis not present

## 2017-05-22 DIAGNOSIS — I10 Essential (primary) hypertension: Secondary | ICD-10-CM | POA: Diagnosis not present

## 2017-05-22 DIAGNOSIS — E119 Type 2 diabetes mellitus without complications: Secondary | ICD-10-CM | POA: Diagnosis not present

## 2017-05-22 DIAGNOSIS — Z79890 Hormone replacement therapy (postmenopausal): Secondary | ICD-10-CM | POA: Diagnosis not present

## 2017-05-22 DIAGNOSIS — Z794 Long term (current) use of insulin: Secondary | ICD-10-CM | POA: Diagnosis not present

## 2017-05-22 DIAGNOSIS — F419 Anxiety disorder, unspecified: Secondary | ICD-10-CM | POA: Diagnosis not present

## 2017-05-22 DIAGNOSIS — Z7982 Long term (current) use of aspirin: Secondary | ICD-10-CM | POA: Diagnosis not present

## 2017-05-22 DIAGNOSIS — K219 Gastro-esophageal reflux disease without esophagitis: Secondary | ICD-10-CM | POA: Diagnosis not present

## 2017-05-22 LAB — BASIC METABOLIC PANEL
Anion gap: 8 (ref 5–15)
BUN: 13 mg/dL (ref 6–20)
CO2: 29 mmol/L (ref 22–32)
Calcium: 9.3 mg/dL (ref 8.9–10.3)
Chloride: 104 mmol/L (ref 101–111)
Creatinine, Ser: 0.78 mg/dL (ref 0.44–1.00)
GFR calc Af Amer: >60 mL/min (ref >60)
GFR calc non Af Amer: >60 mL/min (ref >60)
Glucose, Bld: 142 mg/dL — ABNORMAL HIGH (ref 65–99)
Potassium: 4.5 mmol/L (ref 3.5–5.1)
Sodium: 141 mmol/L (ref 135–145)

## 2017-05-22 NOTE — Progress Notes (Signed)
EKG reviewed by Dr. Gifford Shave, previous EKG and stress test evaluated, will proceed with surgery as scheduled.

## 2017-05-23 ENCOUNTER — Encounter (HOSPITAL_BASED_OUTPATIENT_CLINIC_OR_DEPARTMENT_OTHER): Payer: Self-pay | Admitting: Certified Registered"

## 2017-05-23 ENCOUNTER — Ambulatory Visit (HOSPITAL_BASED_OUTPATIENT_CLINIC_OR_DEPARTMENT_OTHER): Payer: Medicare Other | Admitting: Certified Registered"

## 2017-05-23 ENCOUNTER — Ambulatory Visit (HOSPITAL_BASED_OUTPATIENT_CLINIC_OR_DEPARTMENT_OTHER)
Admission: RE | Admit: 2017-05-23 | Discharge: 2017-05-23 | Disposition: A | Discharge status: Home / Self Care | Payer: Medicare Other | Source: Ambulatory Visit | Attending: Orthopaedic Surgery | Admitting: Orthopaedic Surgery

## 2017-05-23 ENCOUNTER — Encounter (HOSPITAL_BASED_OUTPATIENT_CLINIC_OR_DEPARTMENT_OTHER)
Admission: RE | Disposition: A | Discharge status: Home / Self Care | Payer: Self-pay | Source: Ambulatory Visit | Attending: Orthopaedic Surgery

## 2017-05-23 DIAGNOSIS — Z79890 Hormone replacement therapy (postmenopausal): Secondary | ICD-10-CM | POA: Diagnosis not present

## 2017-05-23 DIAGNOSIS — Z7982 Long term (current) use of aspirin: Secondary | ICD-10-CM | POA: Diagnosis not present

## 2017-05-23 DIAGNOSIS — M1990 Unspecified osteoarthritis, unspecified site: Secondary | ICD-10-CM | POA: Diagnosis not present

## 2017-05-23 DIAGNOSIS — M65342 Trigger finger, left ring finger: Secondary | ICD-10-CM | POA: Diagnosis not present

## 2017-05-23 DIAGNOSIS — Z794 Long term (current) use of insulin: Secondary | ICD-10-CM | POA: Insufficient documentation

## 2017-05-23 DIAGNOSIS — E119 Type 2 diabetes mellitus without complications: Secondary | ICD-10-CM | POA: Insufficient documentation

## 2017-05-23 DIAGNOSIS — F419 Anxiety disorder, unspecified: Secondary | ICD-10-CM | POA: Diagnosis not present

## 2017-05-23 DIAGNOSIS — M65341 Trigger finger, right ring finger: Secondary | ICD-10-CM

## 2017-05-23 DIAGNOSIS — I10 Essential (primary) hypertension: Secondary | ICD-10-CM | POA: Diagnosis not present

## 2017-05-23 DIAGNOSIS — K219 Gastro-esophageal reflux disease without esophagitis: Secondary | ICD-10-CM | POA: Insufficient documentation

## 2017-05-23 HISTORY — PX: TRIGGER FINGER RELEASE: SHX641

## 2017-05-23 HISTORY — PX: STERIOD INJECTION: SHX5046

## 2017-05-23 HISTORY — DX: Unspecified osteoarthritis, unspecified site: M19.90

## 2017-05-23 LAB — GLUCOSE, CAPILLARY
Glucose-Capillary: 143 mg/dL — ABNORMAL HIGH (ref 65–99)
Glucose-Capillary: 143 mg/dL — ABNORMAL HIGH (ref 65–99)

## 2017-05-23 SURGERY — RELEASE, A1 PULLEY, FOR TRIGGER FINGER
Anesthesia: Monitor Anesthesia Care | Site: Hand | Laterality: Right

## 2017-05-23 MED ORDER — PROPOFOL 10 MG/ML IV BOLUS
INTRAVENOUS | Status: DC | PRN
  Administered 2017-05-23 (×2): 20 mg via INTRAVENOUS

## 2017-05-23 MED ORDER — CEFAZOLIN SODIUM-DEXTROSE 2-4 GM/100ML-% IV SOLN
2.0000 g | INTRAVENOUS | Status: AC
  Administered 2017-05-23: 2 g via INTRAVENOUS

## 2017-05-23 MED ORDER — ONDANSETRON HCL 4 MG PO TABS: 4.0000 mg | ORAL_TABLET | Freq: Three times a day (TID) | ORAL | 0 refills | Status: DC | PRN

## 2017-05-23 MED ORDER — FENTANYL CITRATE (PF) 100 MCG/2ML IJ SOLN: 50.0000 ug | INTRAMUSCULAR | Status: DC | PRN

## 2017-05-23 MED ORDER — PHENYLEPHRINE HCL 10 MG/ML IJ SOLN
INTRAMUSCULAR | Status: AC
  Filled 2017-05-23: qty 1

## 2017-05-23 MED ORDER — FENTANYL CITRATE (PF) 100 MCG/2ML IJ SOLN: 25.0000 ug | INTRAMUSCULAR | Status: DC | PRN

## 2017-05-23 MED ORDER — BUPIVACAINE HCL (PF) 0.25 % IJ SOLN
INTRAMUSCULAR | Status: DC | PRN
Start: 2017-05-23 — End: 2017-05-23
  Administered 2017-05-23: 6 mL

## 2017-05-23 MED ORDER — FENTANYL CITRATE (PF) 100 MCG/2ML IJ SOLN
INTRAMUSCULAR | Status: AC
  Filled 2017-05-23: qty 2

## 2017-05-23 MED ORDER — EPHEDRINE 5 MG/ML INJ
INTRAVENOUS | Status: AC
  Filled 2017-05-23: qty 20

## 2017-05-23 MED ORDER — SCOPOLAMINE 1 MG/3DAYS TD PT72: 1.0000 | MEDICATED_PATCH | Freq: Once | TRANSDERMAL | Status: DC | PRN

## 2017-05-23 MED ORDER — LACTATED RINGERS IV SOLN
INTRAVENOUS | Status: DC
  Administered 2017-05-23: 11:00:00 via INTRAVENOUS

## 2017-05-23 MED ORDER — EPHEDRINE 5 MG/ML INJ
INTRAVENOUS | Status: AC
  Filled 2017-05-23: qty 10

## 2017-05-23 MED ORDER — BUPIVACAINE HCL 0.25 % IJ SOLN
INTRAMUSCULAR | Status: DC | PRN
  Administered 2017-05-23: .3 mL

## 2017-05-23 MED ORDER — LIDOCAINE 2% (20 MG/ML) 5 ML SYRINGE
INTRAMUSCULAR | Status: AC
  Filled 2017-05-23: qty 15

## 2017-05-23 MED ORDER — DEXAMETHASONE SODIUM PHOSPHATE 10 MG/ML IJ SOLN
INTRAMUSCULAR | Status: AC
  Filled 2017-05-23: qty 2

## 2017-05-23 MED ORDER — CEFAZOLIN SODIUM-DEXTROSE 2-4 GM/100ML-% IV SOLN
INTRAVENOUS | Status: AC
  Filled 2017-05-23: qty 100

## 2017-05-23 MED ORDER — LIDOCAINE 2% (20 MG/ML) 5 ML SYRINGE
INTRAMUSCULAR | Status: AC
  Filled 2017-05-23: qty 5

## 2017-05-23 MED ORDER — PHENYLEPHRINE 40 MCG/ML (10ML) SYRINGE FOR IV PUSH (FOR BLOOD PRESSURE SUPPORT)
PREFILLED_SYRINGE | INTRAVENOUS | Status: AC
  Filled 2017-05-23: qty 30

## 2017-05-23 MED ORDER — MIDAZOLAM HCL 2 MG/2ML IJ SOLN
INTRAMUSCULAR | Status: AC
  Filled 2017-05-23: qty 2

## 2017-05-23 MED ORDER — SENNOSIDES-DOCUSATE SODIUM 8.6-50 MG PO TABS: 1.0000 | ORAL_TABLET | Freq: Every evening | ORAL | 1 refills | Status: DC | PRN

## 2017-05-23 MED ORDER — LIDOCAINE HCL 1 % IJ SOLN
INTRAMUSCULAR | Status: DC | PRN
  Administered 2017-05-23: .3 mL

## 2017-05-23 MED ORDER — ARTIFICIAL TEARS OPHTHALMIC OINT
TOPICAL_OINTMENT | OPHTHALMIC | Status: AC
  Filled 2017-05-23: qty 3.5

## 2017-05-23 MED ORDER — LIDOCAINE HCL (PF) 1 % IJ SOLN
INTRAMUSCULAR | Status: AC
  Filled 2017-05-23: qty 5

## 2017-05-23 MED ORDER — PROMETHAZINE HCL 25 MG PO TABS: 25.0000 mg | ORAL_TABLET | Freq: Four times a day (QID) | ORAL | 1 refills | Status: DC | PRN

## 2017-05-23 MED ORDER — HYDROCODONE-ACETAMINOPHEN 7.5-325 MG PO TABS: 1.0000 | ORAL_TABLET | Freq: Four times a day (QID) | ORAL | 0 refills | Status: DC | PRN

## 2017-05-23 MED ORDER — ONDANSETRON HCL 4 MG/2ML IJ SOLN: 4.0000 mg | Freq: Once | INTRAMUSCULAR | Status: DC | PRN

## 2017-05-23 MED ORDER — ONDANSETRON HCL 4 MG/2ML IJ SOLN
INTRAMUSCULAR | Status: AC
  Filled 2017-05-23: qty 4

## 2017-05-23 MED ORDER — GLYCOPYRROLATE 0.2 MG/ML IJ SOLN
INTRAMUSCULAR | Status: DC | PRN
  Administered 2017-05-23: 0.2 mg via INTRAVENOUS

## 2017-05-23 MED ORDER — METHYLPREDNISOLONE ACETATE 40 MG/ML IJ SUSP
INTRAMUSCULAR | Status: AC
  Filled 2017-05-23: qty 1

## 2017-05-23 MED ORDER — MIDAZOLAM HCL 5 MG/5ML IJ SOLN
INTRAMUSCULAR | Status: DC | PRN
  Administered 2017-05-23: 2 mg via INTRAVENOUS

## 2017-05-23 MED ORDER — MIDAZOLAM HCL 2 MG/2ML IJ SOLN: 1.0000 mg | INTRAMUSCULAR | Status: DC | PRN

## 2017-05-23 MED ORDER — ONDANSETRON HCL 4 MG/2ML IJ SOLN
INTRAMUSCULAR | Status: AC
  Filled 2017-05-23: qty 8

## 2017-05-23 MED ORDER — FENTANYL CITRATE (PF) 100 MCG/2ML IJ SOLN
INTRAMUSCULAR | Status: DC | PRN
  Administered 2017-05-23: 100 ug via INTRAVENOUS

## 2017-05-23 MED ORDER — PROPOFOL 10 MG/ML IV BOLUS
INTRAVENOUS | Status: AC
  Filled 2017-05-23: qty 20

## 2017-05-23 MED ORDER — TRIAMCINOLONE ACETONIDE 40 MG/ML IJ SUSP
INTRAMUSCULAR | Status: DC | PRN
  Administered 2017-05-23: .4 mL via INTRADERMAL

## 2017-05-23 SURGICAL SUPPLY — 48 items
BANDAGE ACE 3X5.8 VEL STRL LF (GAUZE/BANDAGES/DRESSINGS) ×4 IMPLANT
BLADE MINI RND TIP GREEN BEAV (BLADE) ×4 IMPLANT
BLADE SURG 15 STRL LF DISP TIS (BLADE) ×2 IMPLANT
BLADE SURG 15 STRL SS (BLADE) ×4
BNDG CMPR 9X4 STRL LF SNTH (GAUZE/BANDAGES/DRESSINGS) ×2
BNDG ESMARK 4X9 LF (GAUZE/BANDAGES/DRESSINGS) ×4 IMPLANT
BRUSH SCRUB EZ PLAIN DRY (MISCELLANEOUS) ×4 IMPLANT
CORDS BIPOLAR (ELECTRODE) ×2 IMPLANT
COVER BACK TABLE 60X90IN (DRAPES) ×4 IMPLANT
COVER MAYO STAND STRL (DRAPES) ×4 IMPLANT
CUFF TOURNIQUET SINGLE 18IN (TOURNIQUET CUFF) ×2 IMPLANT
DECANTER SPIKE VIAL GLASS SM (MISCELLANEOUS) IMPLANT
DRAPE EXTREMITY T 121X128X90 (DRAPE) ×4 IMPLANT
DRAPE SURG 17X23 STRL (DRAPES) ×4 IMPLANT
GAUZE SPONGE 4X4 12PLY STRL (GAUZE/BANDAGES/DRESSINGS) ×4 IMPLANT
GAUZE SPONGE 4X4 16PLY XRAY LF (GAUZE/BANDAGES/DRESSINGS) ×2 IMPLANT
GAUZE XEROFORM 1X8 LF (GAUZE/BANDAGES/DRESSINGS) ×4 IMPLANT
GLOVE BIO SURGEON STRL SZ 6.5 (GLOVE) ×1 IMPLANT
GLOVE BIO SURGEONS STRL SZ 6.5 (GLOVE) ×1
GLOVE BIOGEL PI IND STRL 6.5 (GLOVE) IMPLANT
GLOVE BIOGEL PI IND STRL 7.0 (GLOVE) IMPLANT
GLOVE BIOGEL PI INDICATOR 6.5 (GLOVE) ×2
GLOVE BIOGEL PI INDICATOR 7.0 (GLOVE) ×2
GLOVE ECLIPSE 6.5 STRL STRAW (GLOVE) ×2 IMPLANT
GLOVE SKINSENSE NS SZ7.5 (GLOVE) ×2
GLOVE SKINSENSE STRL SZ7.5 (GLOVE) ×2 IMPLANT
GLOVE SURG SYN 7.5  E (GLOVE) ×2
GLOVE SURG SYN 7.5 E (GLOVE) ×2 IMPLANT
GLOVE SURG SYN 7.5 PF PI (GLOVE) ×2 IMPLANT
GOWN SRG XL LVL 4 BRTHBL STRL (GOWNS) ×2 IMPLANT
GOWN STRL NON-REIN XL LVL4 (GOWNS) ×4
GOWN STRL REIN XL XLG (GOWN DISPOSABLE) ×4 IMPLANT
NDL HYPO 25X1 1.5 SAFETY (NEEDLE) IMPLANT
NEEDLE HYPO 22GX1.5 SAFETY (NEEDLE) ×2 IMPLANT
NEEDLE HYPO 25X1 1.5 SAFETY (NEEDLE) ×8 IMPLANT
NS IRRIG 1000ML POUR BTL (IV SOLUTION) ×4 IMPLANT
PACK BASIN DAY SURGERY FS (CUSTOM PROCEDURE TRAY) ×4 IMPLANT
PAD CAST 3X4 CTTN HI CHSV (CAST SUPPLIES) ×2 IMPLANT
PADDING CAST COTTON 3X4 STRL (CAST SUPPLIES) ×4
RUBBERBAND STERILE (MISCELLANEOUS) ×8 IMPLANT
STOCKINETTE 4X48 STRL (DRAPES) ×4 IMPLANT
SUT ETHILON 4 0 PS 2 18 (SUTURE) ×4 IMPLANT
SYR 3ML 18GX1 1/2 (SYRINGE) ×2 IMPLANT
SYR BULB 3OZ (MISCELLANEOUS) ×4 IMPLANT
SYR CONTROL 10ML LL (SYRINGE) ×2 IMPLANT
TOWEL OR 17X24 6PK STRL BLUE (TOWEL DISPOSABLE) ×4 IMPLANT
TRAY DSU PREP LF (CUSTOM PROCEDURE TRAY) ×4 IMPLANT
UNDERPAD 30X30 (UNDERPADS AND DIAPERS) ×4 IMPLANT

## 2017-05-23 NOTE — Anesthesia Preprocedure Evaluation (Addendum)
Anesthesia Evaluation  Patient identified by MRN, date of birth, ID band Patient awake    Reviewed: Allergy & Precautions, NPO status , Patient's Chart, lab work & pertinent test results, reviewed documented beta blocker date and time   Airway Mallampati: III  TM Distance: >3 FB Neck ROM: Full    Dental  (+) Teeth Intact, Dental Advisory Given   Pulmonary neg pulmonary ROS,    Pulmonary exam normal breath sounds clear to auscultation       Cardiovascular hypertension, Pt. on home beta blockers Normal cardiovascular exam Rhythm:Regular Rate:Normal     Neuro/Psych PSYCHIATRIC DISORDERS Anxiety negative neurological ROS     GI/Hepatic Neg liver ROS, GERD  Medicated and Controlled,  Endo/Other  diabetes, Type 2, Insulin Dependent, Oral Hypoglycemic Agents  Renal/GU Renal disease (FSGS)     Musculoskeletal  (+) Arthritis , Osteoarthritis,    Abdominal   Peds  Hematology negative hematology ROS (+)   Anesthesia Other Findings Day of surgery medications reviewed with the patient.  Reproductive/Obstetrics                            Anesthesia Physical Anesthesia Plan  ASA: II  Anesthesia Plan: Bier Block and MAC   Post-op Pain Management:    Induction: Intravenous  PONV Risk Score and Plan: 2 and Ondansetron and Propofol  Airway Management Planned: Simple Face Mask  Additional Equipment:   Intra-op Plan:   Post-operative Plan:   Informed Consent: I have reviewed the patients History and Physical, chart, labs and discussed the procedure including the risks, benefits and alternatives for the proposed anesthesia with the patient or authorized representative who has indicated his/her understanding and acceptance.   Dental advisory given  Plan Discussed with: CRNA  Anesthesia Plan Comments: (Bier block right hand plus MAC sedation.)       Anesthesia Quick Evaluation

## 2017-05-23 NOTE — H&P (Signed)
PREOPERATIVE H&P  Chief Complaint: bilateral ring finger trigger finger  HPI: Paula Richard is a 66 y.o. female who presents for surgical treatment of bilateral ring finger trigger finger.  She denies any changes in medical history.  Past Medical History:  Diagnosis Date  . Anxiety   . Arthritis   . Diabetes mellitus without complication ([place])   . FSGS (focal segmental glomerulosclerosis)   . GERD (gastroesophageal reflux disease)   . Hypertension   . Neuropathy   . Renal disorder   . Seasonal allergies   . Trigger finger of both hands    bil ring fingers   Past Surgical History:  Procedure Laterality Date  . ABDOMINAL HYSTERECTOMY    . CARPAL TUNNEL RELEASE    . FOOT SURGERY Bilateral   . TONSILLECTOMY     Social History   Social History  . Marital status: Married    Spouse name: N/A  . Number of children: N/A  . Years of education: N/A   Social History Main Topics  . Smoking status: Never Smoker  . Smokeless tobacco: Never Used  . Alcohol use No  . Drug use: No  . Sexual activity: Not Asked   Other Topics Concern  . None   Social History Narrative  . None   History reviewed. No pertinent family history. No Known Allergies Prior to Admission medications   Medication Sig Start Date End Date Taking? Authorizing Provider  aspirin 81 MG tablet Take 81 mg by mouth daily.   Yes [provider]  atorvastatin (LIPITOR) 40 MG tablet Take 40 mg by mouth daily.   Yes [provider]  Estradiol (ESTRACE PO) Take by mouth daily.   Yes [provider]  Fexofenadine HCl (ALLEGRA PO) Take by mouth daily.   Yes [provider]  gabapentin (NEURONTIN) 300 MG capsule Take 300 mg by mouth QID.   Yes [provider]  insulin aspart (NOVOLOG) 100 UNIT/ML injection Inject 10 Units into the skin 3 (three) times daily with meals.    Yes [provider]  LEVEMIR FLEXTOUCH 100 UNIT/ML Pen 50 Units at bedtime. 03/03/15   Yes [provider]  metFORMIN (GLUCOPHAGE) 500 MG tablet Take 500 mg by mouth 3 (three) times daily before meals.   Yes [provider]  pantoprazole (PROTONIX) 20 MG tablet Take 20 mg by mouth daily.   Yes [provider]  propranolol (INDERAL) 80 MG tablet Take 80 mg by mouth 2 (two) times daily.    Yes [provider]  traMADol-acetaminophen (ULTRACET) 37.5-325 MG tablet TK 1 T PO  BID PRN P 07/30/16  Yes [provider]  venlafaxine (EFFEXOR) 75 MG tablet Take 75 mg by mouth 2 (two) times daily.   Yes [provider]  diazepam (VALIUM) 5 MG tablet Take 5 mg by mouth every 6 (six) hours as needed for anxiety.    [provider]     Positive ROS: All other systems have been reviewed and were otherwise negative with the exception of those mentioned in the HPI and as above.  Physical Exam: General: Alert, no acute distress Cardiovascular: No pedal edema Respiratory: No cyanosis, no use of accessory musculature GI: abdomen soft Skin: No lesions in the area of chief complaint Neurologic: Sensation intact distally Psychiatric: Patient is competent for consent with normal mood and affect Lymphatic: no lymphedema  MUSCULOSKELETAL: exam stable  Assessment: bilateral ring finger trigger finger  Plan: Plan for Procedure(s): RIGHT RING FINGER  RELEASE TRIGGER FINGER AND TENOLYSIS, LEFT RING FINGER TRIGGER FINGER INJECTION  The risks benefits and alternatives were discussed with the patient including but not limited to the risks of nonoperative treatment, versus surgical intervention including infection, bleeding, nerve injury,  blood clots, cardiopulmonary complications, morbidity, mortality, among others, and they were willing to proceed.   Eduard Roux, MD   05/23/2017 11:36 AM

## 2017-05-23 NOTE — Op Note (Signed)
   Date of Surgery: 05/23/2017  INDICATIONS: Paula Richard is a 66 y.o.-year-old female who presents for surgical treatment of a bilateral ring trigger finger;  The patient did consent to the procedure after discussion of the risks and benefits.  PREOPERATIVE DIAGNOSIS: bilateral ring trigger finger  POSTOPERATIVE DIAGNOSIS: Same.  PROCEDURE:  1. Incision of A1 pulley for trigger finger release, right ring finger 2. Injection of left ring trigger finger  SURGEON: N. Eduard Roux, M.D.  ASSIST: none.  ANESTHESIA:  Bier block  IV FLUIDS AND URINE: See anesthesia.  ESTIMATED BLOOD LOSS: minimal mL  COMPLICATIONS: None.  DESCRIPTION OF PROCEDURE: The patient was identified in the preoperative holding area. The operative site was marked by the surgeon confirmed with the patient. He is brought back to the operating room. She was placed supine on table. A nonsterile tourniquet was placed on the upper forearm. The extremity was exsanguinated using Esmarch bandage and the tourniquet was inflated to 250 mmHg. The Bier block was administered. The operative extremity was prepped and draped in standard sterile fashion. Timeout was performed. Antibiotics were given. Timeout was performed. A horizontal incision based in the palmar crease in line with the ring finger was used. Blunt dissection was taken down to the level of the flexor tendon. The neurovascular bundles were identified on each side of the tendon sheath and protected. The possible edge of the A1 pulley was identified. This was sharply incised. Of note the tendon was of good quality and did not exhibit any tears. The A1 pulley was incised along its full width. Care was taken not to violate the A2 pulley. The palmar pulley was then visualized and released also. The tourniquet was then deflated and hemostasis was obtained. Local anesthesia was infiltrated. The wound was thoroughly irrigated and closed with 3-0 nylon sutures. Sterile dressings were  applied and the hand was placed in a soft dressing. I then turned my attention to the left ring trigger finger injection.    Procedure injection A1 pulley 1/3 cc each of depo medrol, 1% lidocaine, 0.5% marcaine Skin prep alcohol and ethyl chloride Timeout confirmed the injection site  After cleaning the skin with alcohol and anesthetizing the skin with ethyl chloride the A1 pulley was palpated and the injection was performed without complication   POSTOPERATIVE PLAN: Patient will be weight bearing as tolerated and to avoid heavy lifting for 4 weeks.    Azucena Cecil, MD [place] 12:02 PM

## 2017-05-23 NOTE — Transfer of Care (Signed)
Immediate Anesthesia Transfer of Care Note  Patient: Paula Richard  Procedure(s) Performed: Procedure(s): RIGHT RING FINGER RELEASE TRIGGER FINGER AND TENOLYSIS, LEFT RING FINGER TRIGGER FINGER INJECTION (Right)  Patient Location: PACU  Anesthesia Type:Bier block  Level of Consciousness: awake and patient cooperative  Airway & Oxygen Therapy: Patient Spontanous Breathing and Patient connected to face mask oxygen  Post-op Assessment: Report given to RN and Post -op Vital signs reviewed and stable  Post vital signs: Reviewed and stable  Last Vitals:  Vitals:   05/23/17 1041  BP: (!) 154/78  Pulse: 62  Resp: 18  Temp: 36.6 C    Last Pain:  Vitals:   05/23/17 1041  TempSrc: Oral  PainSc: 5       Patients Stated Pain Goal: 2 (11/55/20 8022)  Complications: No apparent anesthesia complications

## 2017-05-23 NOTE — Discharge Instructions (Signed)
Postoperative instructions:  Weightbearing instructions: as tolerated, no heavy lifting  Keep your dressing and/or splint clean and dry at all times.  You can remove your dressing on post-operative day #3 and change with a dry/sterile dressing or Band-Aids as needed thereafter.    Incision instructions:  Do not soak your incision for 3 weeks after surgery.  If the incision gets wet, pat dry and do not scrub the incision.  Pain control:  You have been given a prescription to be taken as directed for post-operative pain control.  In addition, elevate the operative extremity above the heart at all times to prevent swelling and throbbing pain.  Take over-the-counter Colace, 100mg  by mouth twice a day while taking narcotic pain medications to help prevent constipation.   Post Anesthesia Home Care Instructions  Activity: Get plenty of rest for the remainder of the day. A responsible individual must stay with you for 24 hours following the procedure.  For the next 24 hours, DO NOT: -Drive a car -Paediatric nurse -Drink alcoholic beverages -Take any medication unless instructed by your physician -Make any legal decisions or sign important papers.  Meals: Start with liquid foods such as gelatin or soup. Progress to regular foods as tolerated. Avoid greasy, spicy, heavy foods. If nausea and/or vomiting occur, drink only clear liquids until the nausea and/or vomiting subsides. Call your physician if vomiting continues.  Special Instructions/Symptoms: Your throat may feel dry or sore from the anesthesia or the breathing tube placed in your throat during surgery. If this causes discomfort, gargle with warm salt water. The discomfort should disappear within 24 hours.  If you had a scopolamine patch placed behind your ear for the management of post- operative nausea and/or vomiting:  1. The medication in the patch is effective for 72 hours, after which it should be removed.  Wrap patch in a tissue  and discard in the trash. Wash hands thoroughly with soap and water. 2. You may remove the patch earlier than 72 hours if you experience unpleasant side effects which may include dry mouth, dizziness or visual disturbances. 3. Avoid touching the patch. Wash your hands with soap and water after contact with the patch.     Follow up appointments: 1) 10-14 days for suture removal and wound check. 2) Dr. Erlinda Hong as scheduled.   -------------------------------------------------------------------------------------------------------------  After Surgery Pain Control:  After your surgery, post-surgical discomfort or pain is likely. This discomfort can last several days to a few weeks. At certain times of the day your discomfort may be more intense.  Did you receive a nerve block?  A nerve block can provide pain relief for one hour to two days after your surgery. As long as the nerve block is working, you will experience little or no sensation in the area the surgeon operated on.  As the nerve block wears off, you will begin to experience pain or discomfort. It is very important that you begin taking your prescribed pain medication before the nerve block fully wears off. Treating your pain at the first sign of the block wearing off will ensure your pain is better controlled and more tolerable when full-sensation returns. Do not wait until the pain is intolerable, as the medicine will be less effective. It is better to treat pain in advance than to try and catch up.  General Anesthesia:  If you did not receive a nerve block during your surgery, you will need to start taking your pain medication shortly after your surgery and should  continue to do so as prescribed by your surgeon.  Pain Medication:  Most commonly we prescribe Vicodin and Percocet for post-operative pain. Both of these medications contain a combination of acetaminophen (Tylenol) and a narcotic to help control pain.   It takes between 30 and  45 minutes before pain medication starts to work. It is important to take your medication before your pain level gets too intense.   Nausea is a common side effect of many pain medications. You will want to eat something before taking your pain medicine to help prevent nausea.   If you are taking a prescription pain medication that contains acetaminophen, we recommend that you do not take additional over the counter acetaminophen (Tylenol).  Other pain relieving options:   Using a cold pack to ice the affected area a few times a day (15 to 20 minutes at a time) can help to relieve pain, reduce swelling and bruising.   Elevation of the affected area can also help to reduce pain and swelling.

## 2017-05-23 NOTE — Anesthesia Procedure Notes (Signed)
Anesthesia Regional Block: Bier block (IV Regional)   Pre-Anesthetic Checklist: ,, timeout performed, Correct Patient, Correct Site, Correct Laterality, Correct Procedure, Correct Position, site marked, Risks and benefits discussed,  Surgical consent,  Pre-op evaluation,  At surgeon's request and post-op pain management  Laterality: Right  Prep: chloraprep        Narrative:  Start time: 05/23/2017 11:40 AM End time: 05/23/2017 11:42 AM

## 2017-05-23 NOTE — Anesthesia Postprocedure Evaluation (Signed)
Anesthesia Post Note  Patient: Paula Richard  Procedure(s) Performed: Procedure(s) (LRB): RIGHT RING FINGER RELEASE TRIGGER FINGER AND TENOLYSIS, LEFT RING FINGER TRIGGER FINGER INJECTION (Right) LEFT TRIGGER FINGER INJECTION (Left)     Patient location during evaluation: PACU Anesthesia Type: MAC Level of consciousness: awake and alert Pain management: pain level controlled Vital Signs Assessment: post-procedure vital signs reviewed and stable Respiratory status: spontaneous breathing, nonlabored ventilation and respiratory function stable Cardiovascular status: stable and blood pressure returned to baseline Anesthetic complications: no Comments: No antiemetics given due to MAC procedure, and no patient complaint of nausea/vomiting.     Last Vitals:  Vitals:   05/23/17 1230 05/23/17 1245  BP: 134/76 136/68  Pulse: 74 73  Resp: 13 14  Temp:      Last Pain:  Vitals:   05/23/17 1245  TempSrc:   PainSc: 0-No pain                 Catalina Gravel

## 2017-05-25 ENCOUNTER — Encounter (HOSPITAL_BASED_OUTPATIENT_CLINIC_OR_DEPARTMENT_OTHER): Payer: Self-pay | Admitting: Orthopaedic Surgery

## 2017-06-05 ENCOUNTER — Ambulatory Visit (INDEPENDENT_AMBULATORY_CARE_PROVIDER_SITE_OTHER): Payer: Medicare Other | Admitting: Orthopaedic Surgery

## 2017-06-05 ENCOUNTER — Encounter (INDEPENDENT_AMBULATORY_CARE_PROVIDER_SITE_OTHER): Payer: Self-pay | Admitting: Orthopaedic Surgery

## 2017-06-05 DIAGNOSIS — M65341 Trigger finger, right ring finger: Secondary | ICD-10-CM

## 2017-06-05 DIAGNOSIS — M65342 Trigger finger, left ring finger: Secondary | ICD-10-CM

## 2017-06-05 NOTE — Progress Notes (Signed)
Patient is status post right ring trigger finger release and left ring trigger finger injection. She is doing well from both standpoints. The incisions healed. The sutures were removed. She has no more active triggering. She still has a little bit of swelling in her operative hand is preventing her from making a full fist but she does not have any pain or any signs of dystrophic syndrome. At this we'll see her back in 4 weeks for recheck.

## 2017-06-21 DIAGNOSIS — I10 Essential (primary) hypertension: Secondary | ICD-10-CM | POA: Diagnosis not present

## 2017-06-21 DIAGNOSIS — L0889 Other specified local infections of the skin and subcutaneous tissue: Secondary | ICD-10-CM | POA: Diagnosis not present

## 2017-06-21 DIAGNOSIS — Z6828 Body mass index (BMI) 28.0-28.9, adult: Secondary | ICD-10-CM | POA: Diagnosis not present

## 2017-06-21 DIAGNOSIS — E119 Type 2 diabetes mellitus without complications: Secondary | ICD-10-CM | POA: Diagnosis not present

## 2017-07-02 ENCOUNTER — Other Ambulatory Visit: Payer: Self-pay

## 2017-07-02 DIAGNOSIS — L820 Inflamed seborrheic keratosis: Secondary | ICD-10-CM | POA: Diagnosis not present

## 2017-07-02 DIAGNOSIS — D229 Melanocytic nevi, unspecified: Secondary | ICD-10-CM | POA: Diagnosis not present

## 2017-07-02 DIAGNOSIS — D492 Neoplasm of unspecified behavior of bone, soft tissue, and skin: Secondary | ICD-10-CM | POA: Diagnosis not present

## 2017-07-02 DIAGNOSIS — L723 Sebaceous cyst: Secondary | ICD-10-CM | POA: Diagnosis not present

## 2017-07-03 ENCOUNTER — Encounter (INDEPENDENT_AMBULATORY_CARE_PROVIDER_SITE_OTHER): Payer: Self-pay | Admitting: Orthopaedic Surgery

## 2017-07-03 ENCOUNTER — Ambulatory Visit (INDEPENDENT_AMBULATORY_CARE_PROVIDER_SITE_OTHER): Payer: Medicare Other | Admitting: Orthopaedic Surgery

## 2017-07-03 DIAGNOSIS — M65341 Trigger finger, right ring finger: Secondary | ICD-10-CM

## 2017-07-03 NOTE — Progress Notes (Signed)
Patient is 6 weeks status post trigger finger release of he right ring finger. She is doing well. She just complains of some soreness otherwise is doing well. She's happy with her result. Her exam is benign. She has no drainage, normal motion and strength At this point I'm going to release her. Questions encouraged and answered. Follow-up as needed.

## 2017-07-10 DIAGNOSIS — E784 Other hyperlipidemia: Secondary | ICD-10-CM | POA: Diagnosis not present

## 2017-07-10 DIAGNOSIS — I10 Essential (primary) hypertension: Secondary | ICD-10-CM | POA: Diagnosis not present

## 2017-07-10 DIAGNOSIS — E119 Type 2 diabetes mellitus without complications: Secondary | ICD-10-CM | POA: Diagnosis not present

## 2017-07-12 DIAGNOSIS — Z1231 Encounter for screening mammogram for malignant neoplasm of breast: Secondary | ICD-10-CM | POA: Diagnosis not present

## 2017-08-02 DIAGNOSIS — L820 Inflamed seborrheic keratosis: Secondary | ICD-10-CM | POA: Diagnosis not present

## 2017-08-02 DIAGNOSIS — L723 Sebaceous cyst: Secondary | ICD-10-CM | POA: Diagnosis not present

## 2017-08-02 DIAGNOSIS — L281 Prurigo nodularis: Secondary | ICD-10-CM | POA: Diagnosis not present

## 2017-09-10 DIAGNOSIS — L089 Local infection of the skin and subcutaneous tissue, unspecified: Secondary | ICD-10-CM | POA: Diagnosis not present

## 2017-09-10 DIAGNOSIS — L723 Sebaceous cyst: Secondary | ICD-10-CM | POA: Diagnosis not present

## 2017-09-24 DIAGNOSIS — Z1212 Encounter for screening for malignant neoplasm of rectum: Secondary | ICD-10-CM | POA: Diagnosis not present

## 2017-09-24 DIAGNOSIS — Z1211 Encounter for screening for malignant neoplasm of colon: Secondary | ICD-10-CM | POA: Diagnosis not present

## 2017-10-26 DIAGNOSIS — L570 Actinic keratosis: Secondary | ICD-10-CM | POA: Diagnosis not present

## 2017-12-06 ENCOUNTER — Telehealth (INDEPENDENT_AMBULATORY_CARE_PROVIDER_SITE_OTHER): Payer: Self-pay

## 2017-12-06 NOTE — Telephone Encounter (Signed)
Yes she should come in to see [place] first since it's a new problem and I haven't seen her in 6 months

## 2017-12-06 NOTE — Telephone Encounter (Signed)
Patient wants surgery on left hand trigger finger.  Does she need ROV or just schedule?  If schedule, need surgery order.

## 2017-12-12 NOTE — Telephone Encounter (Signed)
Please call patient to schedule office visit.

## 2017-12-13 DIAGNOSIS — L723 Sebaceous cyst: Secondary | ICD-10-CM | POA: Diagnosis not present

## 2017-12-17 ENCOUNTER — Encounter (INDEPENDENT_AMBULATORY_CARE_PROVIDER_SITE_OTHER): Payer: Self-pay | Admitting: Orthopaedic Surgery

## 2017-12-17 ENCOUNTER — Ambulatory Visit (INDEPENDENT_AMBULATORY_CARE_PROVIDER_SITE_OTHER): Payer: Medicare Other | Admitting: Orthopaedic Surgery

## 2017-12-17 DIAGNOSIS — M65342 Trigger finger, left ring finger: Secondary | ICD-10-CM | POA: Diagnosis not present

## 2017-12-17 NOTE — Progress Notes (Signed)
Office Visit Note   Patient: Paula Richard           Date of Birth: 1951-05-22           MRN: [id number] Visit Date: 12/17/2017              Requested by: Velna Hatchet, MD [address] [place],  [address] PCP: Velna Hatchet, MD   Assessment & Plan: Visit Diagnoses:  1. Trigger finger, left ring finger     Plan: Impression is 67 year old female with left ring trigger finger.  She has had partial relief from the cortisone injection.  At this point she would like to proceed with trigger finger release and tenolysis.  We will get her scheduled in the near future.  Follow-Up Instructions: Return if symptoms worsen or fail to improve.   Orders:  No orders of the defined types were placed in this encounter.  No orders of the defined types were placed in this encounter.     Procedures: No procedures performed   Clinical Data: No additional findings.   Subjective: Chief Complaint  Patient presents with  . Hand Pain    left ring trigger finger-wants to discuss surgery    Patient comes in for recurrent left ring trigger finger.  She had this previously injected about 6-7 months ago.  This has recurred.  She would like to have release of the trigger finger.  She did well from the right ring trigger finger release.    Review of Systems   Objective: Vital Signs: There were no vitals taken for this visit.  Physical Exam  Ortho Exam Left hand exam consistent with trigger finger of the ring finger. Specialty Comments:  No specialty comments available.  Imaging: No results found.   PMFS History: Patient Active Problem List   Diagnosis Date Noted  . Unilateral primary osteoarthritis, left knee 05/08/2017  . Trigger finger, left ring finger 05/08/2017  . Trigger finger, right ring finger 10/23/2016  . Other chest pain 04/14/2015  . Type 2 diabetes mellitus without complication ([place]) 44/62/8638  . Hyperlipidemia 04/14/2015  . Essential  hypertension 04/14/2015  . Epigastric pain 04/14/2015  . Palpitations 04/14/2015   Past Medical History:  Diagnosis Date  . Anxiety   . Arthritis   . Diabetes mellitus without complication ([place])   . FSGS (focal segmental glomerulosclerosis)   . GERD (gastroesophageal reflux disease)   . Hypertension   . Neuropathy   . Renal disorder   . Seasonal allergies   . Trigger finger of both hands    bil ring fingers    History reviewed. No pertinent family history.  Past Surgical History:  Procedure Laterality Date  . ABDOMINAL HYSTERECTOMY    . CARPAL TUNNEL RELEASE    . FOOT SURGERY Bilateral   . STERIOD INJECTION Left 05/23/2017   Procedure: LEFT TRIGGER FINGER INJECTION;  Surgeon: Leandrew Koyanagi, MD;  Location: [facility];  Service: Orthopedics;  Laterality: Left;  . TONSILLECTOMY    . TRIGGER FINGER RELEASE Right 05/23/2017   Procedure: RIGHT RING FINGER RELEASE TRIGGER FINGER AND TENOLYSIS, LEFT RING FINGER TRIGGER FINGER INJECTION;  Surgeon: Leandrew Koyanagi, MD;  Location: [facility];  Service: Orthopedics;  Laterality: Right;   Social History   Occupational History  . Not on file  Tobacco Use  . Smoking status: Never Smoker  . Smokeless tobacco: Never Used  Substance and Sexual Activity  . Alcohol use: No  . Drug use: No  .  Sexual activity: Not on file

## 2017-12-21 ENCOUNTER — Encounter (HOSPITAL_BASED_OUTPATIENT_CLINIC_OR_DEPARTMENT_OTHER): Payer: Self-pay | Admitting: *Deleted

## 2017-12-21 ENCOUNTER — Other Ambulatory Visit: Payer: Self-pay

## 2017-12-25 ENCOUNTER — Encounter (HOSPITAL_BASED_OUTPATIENT_CLINIC_OR_DEPARTMENT_OTHER)
Admission: RE | Admit: 2017-12-25 | Discharge: 2017-12-25 | Disposition: A | Discharge status: Home / Self Care | Payer: Medicare Other | Source: Ambulatory Visit | Attending: Orthopaedic Surgery | Admitting: Orthopaedic Surgery

## 2017-12-25 DIAGNOSIS — K219 Gastro-esophageal reflux disease without esophagitis: Secondary | ICD-10-CM | POA: Diagnosis not present

## 2017-12-25 DIAGNOSIS — Z794 Long term (current) use of insulin: Secondary | ICD-10-CM | POA: Diagnosis not present

## 2017-12-25 DIAGNOSIS — M65342 Trigger finger, left ring finger: Secondary | ICD-10-CM | POA: Diagnosis not present

## 2017-12-25 DIAGNOSIS — E1140 Type 2 diabetes mellitus with diabetic neuropathy, unspecified: Secondary | ICD-10-CM | POA: Diagnosis not present

## 2017-12-25 DIAGNOSIS — Z7982 Long term (current) use of aspirin: Secondary | ICD-10-CM | POA: Diagnosis not present

## 2017-12-25 DIAGNOSIS — I10 Essential (primary) hypertension: Secondary | ICD-10-CM | POA: Diagnosis not present

## 2017-12-25 DIAGNOSIS — Z79899 Other long term (current) drug therapy: Secondary | ICD-10-CM | POA: Diagnosis not present

## 2017-12-25 DIAGNOSIS — M65842 Other synovitis and tenosynovitis, left hand: Secondary | ICD-10-CM | POA: Diagnosis not present

## 2017-12-25 DIAGNOSIS — F419 Anxiety disorder, unspecified: Secondary | ICD-10-CM | POA: Diagnosis not present

## 2017-12-25 LAB — BASIC METABOLIC PANEL
Anion gap: 9 (ref 5–15)
BUN: 8 mg/dL (ref 6–20)
CO2: 26 mmol/L (ref 22–32)
Calcium: 8.9 mg/dL (ref 8.9–10.3)
Chloride: 103 mmol/L (ref 101–111)
Creatinine, Ser: 0.7 mg/dL (ref 0.44–1.00)
GFR calc Af Amer: >60 mL/min (ref >60)
GFR calc non Af Amer: >60 mL/min (ref >60)
Glucose, Bld: 208 mg/dL — ABNORMAL HIGH (ref 65–99)
Potassium: 4.7 mmol/L (ref 3.5–5.1)
Sodium: 138 mmol/L (ref 135–145)

## 2017-12-26 ENCOUNTER — Ambulatory Visit (HOSPITAL_BASED_OUTPATIENT_CLINIC_OR_DEPARTMENT_OTHER): Payer: Medicare Other | Admitting: Anesthesiology

## 2017-12-26 ENCOUNTER — Ambulatory Visit (HOSPITAL_BASED_OUTPATIENT_CLINIC_OR_DEPARTMENT_OTHER)
Admission: RE | Admit: 2017-12-26 | Discharge: 2017-12-26 | Disposition: A | Discharge status: Home / Self Care | Payer: Medicare Other | Source: Ambulatory Visit | Attending: Orthopaedic Surgery | Admitting: Orthopaedic Surgery

## 2017-12-26 ENCOUNTER — Encounter (HOSPITAL_BASED_OUTPATIENT_CLINIC_OR_DEPARTMENT_OTHER)
Admission: RE | Disposition: A | Discharge status: Home / Self Care | Payer: Self-pay | Source: Ambulatory Visit | Attending: Orthopaedic Surgery

## 2017-12-26 ENCOUNTER — Encounter (HOSPITAL_BASED_OUTPATIENT_CLINIC_OR_DEPARTMENT_OTHER): Payer: Self-pay | Admitting: Anesthesiology

## 2017-12-26 ENCOUNTER — Other Ambulatory Visit: Payer: Self-pay

## 2017-12-26 DIAGNOSIS — Z7982 Long term (current) use of aspirin: Secondary | ICD-10-CM | POA: Diagnosis not present

## 2017-12-26 DIAGNOSIS — M65342 Trigger finger, left ring finger: Secondary | ICD-10-CM | POA: Insufficient documentation

## 2017-12-26 DIAGNOSIS — F419 Anxiety disorder, unspecified: Secondary | ICD-10-CM | POA: Insufficient documentation

## 2017-12-26 DIAGNOSIS — M65842 Other synovitis and tenosynovitis, left hand: Secondary | ICD-10-CM | POA: Insufficient documentation

## 2017-12-26 DIAGNOSIS — M65841 Other synovitis and tenosynovitis, right hand: Secondary | ICD-10-CM | POA: Diagnosis not present

## 2017-12-26 DIAGNOSIS — K219 Gastro-esophageal reflux disease without esophagitis: Secondary | ICD-10-CM | POA: Insufficient documentation

## 2017-12-26 DIAGNOSIS — Z794 Long term (current) use of insulin: Secondary | ICD-10-CM | POA: Diagnosis not present

## 2017-12-26 DIAGNOSIS — M65341 Trigger finger, right ring finger: Secondary | ICD-10-CM | POA: Diagnosis not present

## 2017-12-26 DIAGNOSIS — I10 Essential (primary) hypertension: Secondary | ICD-10-CM | POA: Diagnosis not present

## 2017-12-26 DIAGNOSIS — E1140 Type 2 diabetes mellitus with diabetic neuropathy, unspecified: Secondary | ICD-10-CM | POA: Insufficient documentation

## 2017-12-26 DIAGNOSIS — Z79899 Other long term (current) drug therapy: Secondary | ICD-10-CM | POA: Insufficient documentation

## 2017-12-26 HISTORY — PX: TRIGGER FINGER RELEASE: SHX641

## 2017-12-26 LAB — GLUCOSE, CAPILLARY
Glucose-Capillary: 176 mg/dL — ABNORMAL HIGH (ref 65–99)
Glucose-Capillary: 186 mg/dL — ABNORMAL HIGH (ref 65–99)

## 2017-12-26 SURGERY — RELEASE, A1 PULLEY, FOR TRIGGER FINGER
Anesthesia: Monitor Anesthesia Care | Site: Hand | Laterality: Left

## 2017-12-26 MED ORDER — FENTANYL CITRATE (PF) 100 MCG/2ML IJ SOLN
INTRAMUSCULAR | Status: DC | PRN
  Administered 2017-12-26: 100 ug via INTRAVENOUS

## 2017-12-26 MED ORDER — ONDANSETRON HCL 4 MG/2ML IJ SOLN
INTRAMUSCULAR | Status: DC | PRN
  Administered 2017-12-26: 4 mg via INTRAVENOUS

## 2017-12-26 MED ORDER — CEFAZOLIN SODIUM-DEXTROSE 2-4 GM/100ML-% IV SOLN
2.0000 g | INTRAVENOUS | Status: AC
  Administered 2017-12-26: 2 g via INTRAVENOUS

## 2017-12-26 MED ORDER — PROMETHAZINE HCL 25 MG PO TABS: 25.0000 mg | ORAL_TABLET | Freq: Four times a day (QID) | ORAL | 1 refills | Status: DC | PRN

## 2017-12-26 MED ORDER — FENTANYL CITRATE (PF) 100 MCG/2ML IJ SOLN
INTRAMUSCULAR | Status: AC
  Filled 2017-12-26: qty 2

## 2017-12-26 MED ORDER — SCOPOLAMINE 1 MG/3DAYS TD PT72: 1.0000 | MEDICATED_PATCH | Freq: Once | TRANSDERMAL | Status: DC | PRN

## 2017-12-26 MED ORDER — BUPIVACAINE HCL (PF) 0.25 % IJ SOLN
INTRAMUSCULAR | Status: DC | PRN
  Administered 2017-12-26: 7 mL

## 2017-12-26 MED ORDER — LACTATED RINGERS IV SOLN
INTRAVENOUS | Status: DC
  Administered 2017-12-26: 08:00:00 via INTRAVENOUS

## 2017-12-26 MED ORDER — LIDOCAINE HCL (PF) 0.5 % IJ SOLN
INTRAMUSCULAR | Status: DC | PRN
  Administered 2017-12-26: 50 mL via INTRAVENOUS

## 2017-12-26 MED ORDER — PROPOFOL 500 MG/50ML IV EMUL
INTRAVENOUS | Status: AC
  Filled 2017-12-26: qty 50

## 2017-12-26 MED ORDER — CEFAZOLIN SODIUM-DEXTROSE 2-4 GM/100ML-% IV SOLN
INTRAVENOUS | Status: AC
  Filled 2017-12-26: qty 100

## 2017-12-26 MED ORDER — CHLORHEXIDINE GLUCONATE 4 % EX LIQD: 60.0000 mL | Freq: Once | CUTANEOUS | Status: DC

## 2017-12-26 MED ORDER — MIDAZOLAM HCL 5 MG/5ML IJ SOLN
INTRAMUSCULAR | Status: DC | PRN
  Administered 2017-12-26: 2 mg via INTRAVENOUS

## 2017-12-26 MED ORDER — FENTANYL CITRATE (PF) 100 MCG/2ML IJ SOLN
50.0000 ug | INTRAMUSCULAR | Status: DC | PRN
Start: 2017-12-26 — End: 2017-12-26

## 2017-12-26 MED ORDER — MIDAZOLAM HCL 2 MG/2ML IJ SOLN
INTRAMUSCULAR | Status: AC
  Filled 2017-12-26: qty 2

## 2017-12-26 MED ORDER — FENTANYL CITRATE (PF) 100 MCG/2ML IJ SOLN: 25.0000 ug | INTRAMUSCULAR | Status: DC | PRN

## 2017-12-26 MED ORDER — ONDANSETRON HCL 4 MG/2ML IJ SOLN
INTRAMUSCULAR | Status: AC
  Filled 2017-12-26: qty 2

## 2017-12-26 MED ORDER — HYDROCODONE-ACETAMINOPHEN 5-325 MG PO TABS: 1.0000 | ORAL_TABLET | Freq: Four times a day (QID) | ORAL | 0 refills | Status: DC | PRN

## 2017-12-26 MED ORDER — LIDOCAINE 2% (20 MG/ML) 5 ML SYRINGE
INTRAMUSCULAR | Status: AC
  Filled 2017-12-26: qty 5

## 2017-12-26 MED ORDER — MIDAZOLAM HCL 2 MG/2ML IJ SOLN: 1.0000 mg | INTRAMUSCULAR | Status: DC | PRN

## 2017-12-26 MED ORDER — ACETAMINOPHEN 325 MG PO TABS
975.0000 mg | ORAL_TABLET | Freq: Once | ORAL | Status: AC
  Administered 2017-12-26: 975 mg via ORAL

## 2017-12-26 MED ORDER — PROPOFOL 10 MG/ML IV BOLUS
INTRAVENOUS | Status: AC
  Filled 2017-12-26: qty 20

## 2017-12-26 MED ORDER — ACETAMINOPHEN 325 MG PO TABS
ORAL_TABLET | ORAL | Status: AC
  Filled 2017-12-26: qty 3

## 2017-12-26 SURGICAL SUPPLY — 44 items
BANDAGE ACE 3X5.8 VEL STRL LF (GAUZE/BANDAGES/DRESSINGS) ×2 IMPLANT
BLADE MINI RND TIP GREEN BEAV (BLADE) ×2 IMPLANT
BLADE SURG 15 STRL LF DISP TIS (BLADE) ×1 IMPLANT
BLADE SURG 15 STRL SS (BLADE) ×2
BNDG CMPR 9X4 STRL LF SNTH (GAUZE/BANDAGES/DRESSINGS) ×1
BNDG ESMARK 4X9 LF (GAUZE/BANDAGES/DRESSINGS) ×2 IMPLANT
BRUSH SCRUB EZ PLAIN DRY (MISCELLANEOUS) ×2 IMPLANT
CANISTER SUCT 1200ML W/VALVE (MISCELLANEOUS) ×1 IMPLANT
CORD BIPOLAR FORCEPS 12FT (ELECTRODE) ×1 IMPLANT
COVER BACK TABLE 60X90IN (DRAPES) ×2 IMPLANT
COVER MAYO STAND STRL (DRAPES) ×2 IMPLANT
CUFF TOURNIQUET SINGLE 18IN (TOURNIQUET CUFF) ×1 IMPLANT
DECANTER SPIKE VIAL GLASS SM (MISCELLANEOUS) IMPLANT
DRAPE EXTREMITY T 121X128X90 (DRAPE) ×2 IMPLANT
DRAPE SURG 17X23 STRL (DRAPES) ×2 IMPLANT
GAUZE SPONGE 4X4 12PLY STRL (GAUZE/BANDAGES/DRESSINGS) ×2 IMPLANT
GAUZE XEROFORM 1X8 LF (GAUZE/BANDAGES/DRESSINGS) ×2 IMPLANT
GLOVE BIOGEL PI IND STRL 7.0 (GLOVE) IMPLANT
GLOVE BIOGEL PI INDICATOR 7.0 (GLOVE) ×2
GLOVE ECLIPSE 6.5 STRL STRAW (GLOVE) ×1 IMPLANT
GLOVE ECLIPSE 7.0 STRL STRAW (GLOVE) ×1 IMPLANT
GLOVE SKINSENSE NS SZ7.5 (GLOVE) ×1
GLOVE SKINSENSE STRL SZ7.5 (GLOVE) ×1 IMPLANT
GLOVE SURG SYN 7.5  E (GLOVE) ×1
GLOVE SURG SYN 7.5 E (GLOVE) ×1 IMPLANT
GLOVE SURG SYN 7.5 PF PI (GLOVE) ×1 IMPLANT
GOWN SRG XL LVL 4 BRTHBL STRL (GOWNS) ×1 IMPLANT
GOWN STRL NON-REIN XL LVL4 (GOWNS) ×2
GOWN STRL REIN XL XLG (GOWN DISPOSABLE) ×2 IMPLANT
NDL HYPO 25X1 1.5 SAFETY (NEEDLE) ×1 IMPLANT
NEEDLE HYPO 25X1 1.5 SAFETY (NEEDLE) ×2 IMPLANT
NS IRRIG 1000ML POUR BTL (IV SOLUTION) ×2 IMPLANT
PACK BASIN DAY SURGERY FS (CUSTOM PROCEDURE TRAY) ×2 IMPLANT
PAD CAST 3X4 CTTN HI CHSV (CAST SUPPLIES) ×1 IMPLANT
PADDING CAST COTTON 3X4 STRL (CAST SUPPLIES) ×2
RUBBERBAND STERILE (MISCELLANEOUS) ×4 IMPLANT
STOCKINETTE 4X48 STRL (DRAPES) ×2 IMPLANT
SUT ETHILON 4 0 PS 2 18 (SUTURE) ×2 IMPLANT
SYR BULB 3OZ (MISCELLANEOUS) ×2 IMPLANT
SYR CONTROL 10ML LL (SYRINGE) ×2 IMPLANT
TOWEL OR 17X24 6PK STRL BLUE (TOWEL DISPOSABLE) ×2 IMPLANT
TRAY DSU PREP LF (CUSTOM PROCEDURE TRAY) ×2 IMPLANT
TUBE CONNECTING 20X1/4 (TUBING) ×1 IMPLANT
UNDERPAD 30X30 (UNDERPADS AND DIAPERS) ×2 IMPLANT

## 2017-12-26 NOTE — Anesthesia Procedure Notes (Addendum)
Procedure Name: MAC Date/Time: 12/26/2017 9:50 AM Performed by: Marrianne Mood, CRNA Pre-anesthesia Checklist: Patient identified, Timeout performed, Emergency Drugs available, Suction available and Patient being monitored Patient Re-evaluated:Patient Re-evaluated prior to induction Oxygen Delivery Method: Simple face mask Preoxygenation: Pre-oxygenation with 100% oxygen

## 2017-12-26 NOTE — Anesthesia Preprocedure Evaluation (Signed)
Anesthesia Evaluation  Patient identified by MRN, date of birth, ID band Patient awake    Reviewed: Allergy & Precautions, H&P , Patient's Chart, lab work & pertinent test results, reviewed documented beta blocker date and time   Airway Mallampati: II  TM Distance: >3 FB Neck ROM: full    Dental no notable dental hx.    Pulmonary    Pulmonary exam normal breath sounds clear to auscultation       Cardiovascular hypertension,  Rhythm:regular Rate:Normal     Neuro/Psych    GI/Hepatic   Endo/Other  diabetes, Insulin Dependent  Renal/GU      Musculoskeletal   Abdominal   Peds  Hematology   Anesthesia Other Findings   Reproductive/Obstetrics                             Anesthesia Physical Anesthesia Plan  ASA: II  Anesthesia Plan: MAC and Bier Block and Bier Block-LIDOCAINE ONLY   Post-op Pain Management:    Induction: Intravenous  PONV Risk Score and Plan:   Airway Management Planned: Mask and Natural Airway  Additional Equipment:   Intra-op Plan:   Post-operative Plan:   Informed Consent: I have reviewed the patients History and Physical, chart, labs and discussed the procedure including the risks, benefits and alternatives for the proposed anesthesia with the patient or authorized representative who has indicated his/her understanding and acceptance.   Dental Advisory Given  Plan Discussed with: CRNA and Surgeon  Anesthesia Plan Comments:         Anesthesia Quick Evaluation

## 2017-12-26 NOTE — Op Note (Signed)
   Date of Surgery: 12/26/2017  INDICATIONS: Paula Richard is a 67 y.o.-year-old female who presents for surgical treatment of a left ring trigger finger;  The patient did consent to the procedure after discussion of the risks and benefits.  PREOPERATIVE DIAGNOSIS: left ring trigger finger and tenosynovitis  POSTOPERATIVE DIAGNOSIS: Same.  PROCEDURE:  1. Incision of A1 pulley for trigger finger release, left ring finger 2. Tenolysis of left ring flexor tendon  SURGEON: N. Eduard Roux, M.D.  ASSIST: Ciro Backer Brush, [place]; necessary for the timely completion of procedure and due to complexity of procedure..  ANESTHESIA:  Bier block  IV FLUIDS AND URINE: See anesthesia.  ESTIMATED BLOOD LOSS: minimal mL  COMPLICATIONS: None.  DESCRIPTION OF PROCEDURE: The patient was identified in the preoperative holding area. The operative site was marked by the surgeon confirmed with the patient. He is brought back to the operating room. She was placed supine on table. A nonsterile tourniquet was placed on the upper forearm. The extremity was exsanguinated using Esmarch bandage and the tourniquet was inflated to 250 mmHg. The Bier block was administered. The operative extremity was prepped and draped in standard sterile fashion. Timeout was performed. Antibiotics were given. Timeout was performed. A longitudinal incision based in the palmar crease in line with the ring finger was used. Blunt dissection was taken down to the level of the flexor tendon. The neurovascular bundles were identified on each side of the tendon sheath and protected. The proximal edge of the A1 pulley was identified. This was sharply incised. Of note the tendon was of good quality and did not exhibit any tears. There was mild tenosynovitis of the tendon itself for which I performed tenolysis.  The A1 pulley was incised along its full width. Care was taken not to violate the A2 pulley. The palmar pulley was then visualized and  released also. The tourniquet was then deflated and hemostasis was obtained. Local anesthesia was infiltrated. The wound was thoroughly irrigated and closed with 3-0 nylon sutures. Sterile dressings were applied and the hand was placed in a soft dressing. Patient tolerated the procedure well and was taken to the PACU in stable condition.  POSTOPERATIVE PLAN: Patient will be weight bearing as tolerated and to avoid heavy lifting for 4 weeks.    Azucena Cecil, MD [place] 10:14 AM

## 2017-12-26 NOTE — Transfer of Care (Signed)
Immediate Anesthesia Transfer of Care Note  Patient: PORSHEA JANOWSKI  Procedure(s) Performed: RELEASE TRIGGER FINGER LEFT RING FINGER (Left Hand)  Patient Location: PACU  Anesthesia Type:Bier block  Level of Consciousness: awake and patient cooperative  Airway & Oxygen Therapy: Patient Spontanous Breathing and Patient connected to face mask oxygen  Post-op Assessment: Report given to RN and Post -op Vital signs reviewed and stable  Post vital signs: Reviewed and stable  Last Vitals:  Vitals:   12/26/17 0754  BP: (!) 156/75  Pulse: (!) 59  Resp: 18  Temp: 36.4 C  SpO2: 98%    Last Pain:  Vitals:   12/26/17 0754  TempSrc: Oral  PainSc: 4       Patients Stated Pain Goal: 2 (74/71/85 [id number])  Complications: No apparent anesthesia complications

## 2017-12-26 NOTE — Anesthesia Postprocedure Evaluation (Signed)
Anesthesia Post Note  Patient: Paula Richard  Procedure(s) Performed: RELEASE TRIGGER FINGER LEFT RING FINGER (Left Hand)     Anesthesia Post Evaluation  Last Vitals:  Vitals:   12/26/17 1045 12/26/17 1110  BP: (!) 141/70 (!) 132/57  Pulse: (!) 57 (!) 58  Resp: 19 18  Temp:  36.6 C  SpO2: 97% 98%    Last Pain:  Vitals:   12/26/17 1110  TempSrc:   PainSc: 0-No pain                 Monika Chestang

## 2017-12-26 NOTE — Anesthesia Procedure Notes (Signed)
Anesthesia Regional Block: Bier block (IV Regional)   Pre-Anesthetic Checklist: ,, timeout performed, Correct Patient, Correct Site, Correct Laterality, Correct Procedure, Correct Position, site marked, Risks and benefits discussed,  Surgical consent,  Pre-op evaluation,  At surgeon's request and post-op pain management  Laterality: Left  Prep: chloraprep        Procedures:,,,,,, Esmarch exsanguination,,  Narrative:  Start time: 12/26/2017 9:58 AM End time: 12/26/2017 10:03 AM  Events: blood aspirated,,,,,,,,,,

## 2017-12-26 NOTE — Discharge Instructions (Signed)
Post Anesthesia Home Care Instructions  Activity: Get plenty of rest for the remainder of the day. A responsible individual must stay with you for 24 hours following the procedure.  For the next 24 hours, DO NOT: -Drive a car -Paediatric nurse -Drink alcoholic beverages -Take any medication unless instructed by your physician -Make any legal decisions or sign important papers.  Meals: Start with liquid foods such as gelatin or soup. Progress to regular foods as tolerated. Avoid greasy, spicy, heavy foods. If nausea and/or vomiting occur, drink only clear liquids until the nausea and/or vomiting subsides. Call your physician if vomiting continues.  Special Instructions/Symptoms: Your throat may feel dry or sore from the anesthesia or the breathing tube placed in your throat during surgery. If this causes discomfort, gargle with warm salt water. The discomfort should disappear within 24 hours.  If you had a scopolamine patch placed behind your ear for the management of post- operative nausea and/or vomiting:  1. The medication in the patch is effective for 72 hours, after which it should be removed.  Wrap patch in a tissue and discard in the trash. Wash hands thoroughly with soap and water. 2. You may remove the patch earlier than 72 hours if you experience unpleasant side effects which may include dry mouth, dizziness or visual disturbances. 3. Avoid touching the patch. Wash your hands with soap and water after contact with the patch.       Postoperative instructions:  Weightbearing instructions: no heavy lifting  Keep your dressing and/or splint clean and dry at all times.  You can remove your dressing on post-operative day #3 and change with a dry/sterile dressing or Band-Aids as needed thereafter.    Incision instructions:  Do not soak your incision for 3 weeks after surgery.  If the incision gets wet, pat dry and do not scrub the incision.  Pain control:  You have been given  a prescription to be taken as directed for post-operative pain control.  In addition, elevate the operative extremity above the heart at all times to prevent swelling and throbbing pain.  Take over-the-counter Colace, 100mg  by mouth twice a day while taking narcotic pain medications to help prevent constipation.  Follow up appointments: 1) 10-14 days for suture removal and wound check. 2) Dr. Erlinda Hong as scheduled.   -------------------------------------------------------------------------------------------------------------  After Surgery Pain Control:  After your surgery, post-surgical discomfort or pain is likely. This discomfort can last several days to a few weeks. At certain times of the day your discomfort may be more intense.  Did you receive a nerve block?  A nerve block can provide pain relief for one hour to two days after your surgery. As long as the nerve block is working, you will experience little or no sensation in the area the surgeon operated on.  As the nerve block wears off, you will begin to experience pain or discomfort. It is very important that you begin taking your prescribed pain medication before the nerve block fully wears off. Treating your pain at the first sign of the block wearing off will ensure your pain is better controlled and more tolerable when full-sensation returns. Do not wait until the pain is intolerable, as the medicine will be less effective. It is better to treat pain in advance than to try and catch up.  General Anesthesia:  If you did not receive a nerve block during your surgery, you will need to start taking your pain medication shortly after your surgery and should continue  to do so as prescribed by your surgeon.  Pain Medication:  Most commonly we prescribe Vicodin and Percocet for post-operative pain. Both of these medications contain a combination of acetaminophen (Tylenol) and a narcotic to help control pain.   It takes between 30 and 45 minutes  before pain medication starts to work. It is important to take your medication before your pain level gets too intense.   Nausea is a common side effect of many pain medications. You will want to eat something before taking your pain medicine to help prevent nausea.   If you are taking a prescription pain medication that contains acetaminophen, we recommend that you do not take additional over the counter acetaminophen (Tylenol).  Other pain relieving options:   Using a cold pack to ice the affected area a few times a day (15 to 20 minutes at a time) can help to relieve pain, reduce swelling and bruising.   Elevation of the affected area can also help to reduce pain and swelling.

## 2017-12-26 NOTE — H&P (Signed)
PREOPERATIVE H&P  Chief Complaint: left ring finger trigger finger  HPI: Paula Richard is a 67 y.o. female who presents for surgical treatment of left ring finger trigger finger.  She denies any changes in medical history.  Past Medical History:  Diagnosis Date  . Anxiety   . Arthritis   . Diabetes mellitus without complication ([place])   . FSGS (focal segmental glomerulosclerosis)   . GERD (gastroesophageal reflux disease)   . Hypertension   . Neuropathy   . Renal disorder   . Seasonal allergies   . Trigger finger of both hands    bil ring fingers   Past Surgical History:  Procedure Laterality Date  . ABDOMINAL HYSTERECTOMY    . CARPAL TUNNEL RELEASE    . FOOT SURGERY Bilateral   . STERIOD INJECTION Left 05/23/2017   Procedure: LEFT TRIGGER FINGER INJECTION;  Surgeon: Leandrew Koyanagi, MD;  Location: [facility];  Service: Orthopedics;  Laterality: Left;  . TONSILLECTOMY    . TRIGGER FINGER RELEASE Right 05/23/2017   Procedure: RIGHT RING FINGER RELEASE TRIGGER FINGER AND TENOLYSIS, LEFT RING FINGER TRIGGER FINGER INJECTION;  Surgeon: Leandrew Koyanagi, MD;  Location: [facility];  Service: Orthopedics;  Laterality: Right;   Social History   Socioeconomic History  . Marital status: Married    Spouse name: None  . Number of children: None  . Years of education: None  . Highest education level: None  Social Needs  . Financial resource strain: None  . Food insecurity - worry: None  . Food insecurity - inability: None  . Transportation needs - medical: None  . Transportation needs - non-medical: None  Occupational History  . None  Tobacco Use  . Smoking status: Never Smoker  . Smokeless tobacco: Never Used  Substance and Sexual Activity  . Alcohol use: No  . Drug use: No  . Sexual activity: None  Other Topics Concern  . None  Social History Narrative  . None   History reviewed. No pertinent family history. No Known Allergies Prior  to Admission medications   Medication Sig Start Date End Date Taking? Authorizing Provider  aspirin 81 MG tablet Take 81 mg by mouth daily.   Yes [provider]  atorvastatin (LIPITOR) 40 MG tablet Take 40 mg by mouth daily.   Yes [provider]  Estradiol (ESTRACE PO) Take by mouth daily.   Yes [provider]  Fexofenadine HCl (ALLEGRA PO) Take by mouth daily.   Yes [provider]  gabapentin (NEURONTIN) 300 MG capsule Take 300 mg by mouth QID.   Yes [provider]  Insulin Glargine (TOUJEO SOLOSTAR [place]) Inject 50 Units into the skin at bedtime.   Yes [provider]  Insulin Glulisine (APIDRA [place]) Inject into the skin 3 (three) times daily before meals.   Yes [provider]  metFORMIN (GLUCOPHAGE) 500 MG tablet Take 500 mg by mouth 3 (three) times daily before meals.   Yes [provider]  pantoprazole (PROTONIX) 20 MG tablet Take 20 mg by mouth daily.   Yes [provider]  propranolol (INDERAL) 80 MG tablet Take 80 mg by mouth 2 (two) times daily.    Yes [provider]  traMADol-acetaminophen (ULTRACET) 37.5-325 MG tablet TK 1 T PO  BID PRN P 07/30/16  Yes [provider]  venlafaxine (EFFEXOR) 75 MG tablet Take 75 mg by mouth 2 (two) times daily.   Yes [provider]  promethazine (  PHENERGAN) 25 MG tablet Take 1 tablet (25 mg total) by mouth every 6 (six) hours as needed for nausea. 05/23/17   Leandrew Koyanagi, MD     Positive ROS: All other systems have been reviewed and were otherwise negative with the exception of those mentioned in the HPI and as above.  Physical Exam: General: Alert, no acute distress Cardiovascular: No pedal edema Respiratory: No cyanosis, no use of accessory musculature GI: abdomen soft Skin: No lesions in the area of chief complaint Neurologic: Sensation intact distally Psychiatric: Patient is competent for consent with normal mood and affect Lymphatic:  no lymphedema  MUSCULOSKELETAL: exam stable  Assessment: left ring finger trigger finger  Plan: Plan for Procedure(s): RELEASE TRIGGER FINGER LEFT RING FINGER  The risks benefits and alternatives were discussed with the patient including but not limited to the risks of nonoperative treatment, versus surgical intervention including infection, bleeding, nerve injury,  blood clots, cardiopulmonary complications, morbidity, mortality, among others, and they were willing to proceed.   Eduard Roux, MD   12/26/2017 7:30 AM

## 2017-12-27 ENCOUNTER — Encounter (HOSPITAL_BASED_OUTPATIENT_CLINIC_OR_DEPARTMENT_OTHER): Payer: Self-pay | Admitting: Orthopaedic Surgery

## 2018-01-04 ENCOUNTER — Ambulatory Visit (INDEPENDENT_AMBULATORY_CARE_PROVIDER_SITE_OTHER): Payer: Medicare Other | Admitting: Orthopaedic Surgery

## 2018-01-04 ENCOUNTER — Encounter (INDEPENDENT_AMBULATORY_CARE_PROVIDER_SITE_OTHER): Payer: Self-pay | Admitting: Orthopaedic Surgery

## 2018-01-04 DIAGNOSIS — M65342 Trigger finger, left ring finger: Secondary | ICD-10-CM

## 2018-01-04 NOTE — Progress Notes (Signed)
   Post-Op Visit Note   Patient: Paula Richard           Date of Birth: [birth year]           MRN: [id number] Visit Date: 01/04/2018 PCP: Velna Hatchet, MD   Assessment & Plan:  Chief Complaint:  Chief Complaint  Patient presents with  . Left Ring Finger - Follow-up, Pain, Routine Post Op   Visit Diagnoses:  1. Trigger finger, left ring finger     Plan: Paula Richard comes in for follow-up.  10 days status post left ring finger A1 pulley release.  Date of surgery 12/26/2017.  She has been doing well.  Taking tramadol occasionally for pain.  No fever chills or any other systemic symptoms.  Examination of her incision reveals well-healed surgical incision without evidence of infection no erythema or drainage.  Nylon sutures in place.  She is neurovascular intact distally.  At this point would like for Paula Richard to continue precautions of no heavy lifting for the next 2 weeks.  We will go ahead and take her stitches out today.  She will follow-up with [place] in 4 weeks time for repeat evaluation.  Follow-Up Instructions: Return in about 4 weeks (around 02/01/2018).   Orders:  No orders of the defined types were placed in this encounter.  No orders of the defined types were placed in this encounter.   Imaging: No results found.  PMFS History: Patient Active Problem List   Diagnosis Date Noted  . Unilateral primary osteoarthritis, left knee 05/08/2017  . Trigger finger, left ring finger 05/08/2017  . Trigger finger, right ring finger 10/23/2016  . Other chest pain 04/14/2015  . Type 2 diabetes mellitus without complication ([place]) 40/06/6760  . Hyperlipidemia 04/14/2015  . Essential hypertension 04/14/2015  . Epigastric pain 04/14/2015  . Palpitations 04/14/2015   Past Medical History:  Diagnosis Date  . Anxiety   . Arthritis   . Diabetes mellitus without complication ([place])   . FSGS (focal segmental glomerulosclerosis)   . GERD (gastroesophageal reflux disease)   . Hypertension     . Neuropathy   . Renal disorder   . Seasonal allergies   . Trigger finger of both hands    bil ring fingers    History reviewed. No pertinent family history.  Past Surgical History:  Procedure Laterality Date  . ABDOMINAL HYSTERECTOMY    . CARPAL TUNNEL RELEASE    . FOOT SURGERY Bilateral   . STERIOD INJECTION Left 05/23/2017   Procedure: LEFT TRIGGER FINGER INJECTION;  Surgeon: Leandrew Koyanagi, MD;  Location: [facility];  Service: Orthopedics;  Laterality: Left;  . TONSILLECTOMY    . TRIGGER FINGER RELEASE Right 05/23/2017   Procedure: RIGHT RING FINGER RELEASE TRIGGER FINGER AND TENOLYSIS, LEFT RING FINGER TRIGGER FINGER INJECTION;  Surgeon: Leandrew Koyanagi, MD;  Location: [facility];  Service: Orthopedics;  Laterality: Right;  . TRIGGER FINGER RELEASE Left 12/26/2017   Procedure: RELEASE TRIGGER FINGER LEFT RING FINGER;  Surgeon: Leandrew Koyanagi, MD;  Location: [facility];  Service: Orthopedics;  Laterality: Left;   Social History   Occupational History  . Not on file  Tobacco Use  . Smoking status: Never Smoker  . Smokeless tobacco: Never Used  Substance and Sexual Activity  . Alcohol use: No  . Drug use: No  . Sexual activity: Not on file

## 2018-01-31 ENCOUNTER — Ambulatory Visit (INDEPENDENT_AMBULATORY_CARE_PROVIDER_SITE_OTHER): Payer: Medicare Other | Admitting: Orthopaedic Surgery

## 2018-02-19 ENCOUNTER — Ambulatory Visit (INDEPENDENT_AMBULATORY_CARE_PROVIDER_SITE_OTHER): Payer: Medicare Other | Admitting: Orthopaedic Surgery

## 2018-02-25 DIAGNOSIS — L729 Follicular cyst of the skin and subcutaneous tissue, unspecified: Secondary | ICD-10-CM

## 2018-02-25 HISTORY — DX: Follicular cyst of the skin and subcutaneous tissue, unspecified: L72.9

## 2018-02-28 ENCOUNTER — Ambulatory Visit (INDEPENDENT_AMBULATORY_CARE_PROVIDER_SITE_OTHER): Payer: Medicare Other | Admitting: Orthopaedic Surgery

## 2018-03-04 ENCOUNTER — Ambulatory Visit (INDEPENDENT_AMBULATORY_CARE_PROVIDER_SITE_OTHER): Payer: Medicare Other | Admitting: Orthopaedic Surgery

## 2018-03-04 ENCOUNTER — Encounter (INDEPENDENT_AMBULATORY_CARE_PROVIDER_SITE_OTHER): Payer: Self-pay | Admitting: Orthopaedic Surgery

## 2018-03-04 DIAGNOSIS — M65342 Trigger finger, left ring finger: Secondary | ICD-10-CM

## 2018-03-04 NOTE — Progress Notes (Signed)
Patient is 68 days status post left trigger finger release.  She is doing well.  Just some soreness.  Overall she is happy.  She no longer has triggering.  Surgical scar is fully healed.  She has full range of motion and able to make a full composite fist.  From my standpoint she is doing well.  Follow-up as needed.

## 2018-03-12 ENCOUNTER — Other Ambulatory Visit: Payer: Self-pay

## 2018-03-12 ENCOUNTER — Encounter (HOSPITAL_BASED_OUTPATIENT_CLINIC_OR_DEPARTMENT_OTHER): Payer: Self-pay | Admitting: *Deleted

## 2018-03-12 DIAGNOSIS — J310 Chronic rhinitis: Secondary | ICD-10-CM | POA: Diagnosis not present

## 2018-03-12 DIAGNOSIS — J343 Hypertrophy of nasal turbinates: Secondary | ICD-10-CM | POA: Diagnosis not present

## 2018-03-12 DIAGNOSIS — J342 Deviated nasal septum: Secondary | ICD-10-CM | POA: Diagnosis not present

## 2018-03-12 NOTE — Pre-Procedure Instructions (Signed)
To come for BMET 

## 2018-03-26 ENCOUNTER — Encounter (HOSPITAL_BASED_OUTPATIENT_CLINIC_OR_DEPARTMENT_OTHER)
Admission: RE | Admit: 2018-03-26 | Discharge: 2018-03-26 | Disposition: A | Discharge status: Home / Self Care | Payer: Medicare Other | Source: Ambulatory Visit | Attending: Surgery | Admitting: Surgery

## 2018-03-26 DIAGNOSIS — Z79899 Other long term (current) drug therapy: Secondary | ICD-10-CM | POA: Diagnosis not present

## 2018-03-26 DIAGNOSIS — Z7982 Long term (current) use of aspirin: Secondary | ICD-10-CM | POA: Diagnosis not present

## 2018-03-26 DIAGNOSIS — E1140 Type 2 diabetes mellitus with diabetic neuropathy, unspecified: Secondary | ICD-10-CM | POA: Diagnosis not present

## 2018-03-26 DIAGNOSIS — E785 Hyperlipidemia, unspecified: Secondary | ICD-10-CM | POA: Diagnosis not present

## 2018-03-26 DIAGNOSIS — F419 Anxiety disorder, unspecified: Secondary | ICD-10-CM | POA: Diagnosis not present

## 2018-03-26 DIAGNOSIS — M170 Bilateral primary osteoarthritis of knee: Secondary | ICD-10-CM | POA: Diagnosis not present

## 2018-03-26 DIAGNOSIS — M479 Spondylosis, unspecified: Secondary | ICD-10-CM | POA: Diagnosis not present

## 2018-03-26 DIAGNOSIS — L723 Sebaceous cyst: Secondary | ICD-10-CM | POA: Diagnosis present

## 2018-03-26 DIAGNOSIS — L720 Epidermal cyst: Secondary | ICD-10-CM | POA: Diagnosis not present

## 2018-03-26 DIAGNOSIS — K219 Gastro-esophageal reflux disease without esophagitis: Secondary | ICD-10-CM | POA: Diagnosis not present

## 2018-03-26 DIAGNOSIS — I10 Essential (primary) hypertension: Secondary | ICD-10-CM | POA: Diagnosis not present

## 2018-03-26 DIAGNOSIS — Z794 Long term (current) use of insulin: Secondary | ICD-10-CM | POA: Diagnosis not present

## 2018-03-26 LAB — BASIC METABOLIC PANEL
Anion gap: 7 (ref 5–15)
BUN: 12 mg/dL (ref 6–20)
CO2: 29 mmol/L (ref 22–32)
Calcium: 9.6 mg/dL (ref 8.9–10.3)
Chloride: 100 mmol/L — ABNORMAL LOW (ref 101–111)
Creatinine, Ser: 0.88 mg/dL (ref 0.44–1.00)
GFR calc Af Amer: >60 mL/min (ref >60)
GFR calc non Af Amer: >60 mL/min (ref >60)
Glucose, Bld: 195 mg/dL — ABNORMAL HIGH (ref 65–99)
Potassium: 4.6 mmol/L (ref 3.5–5.1)
Sodium: 136 mmol/L (ref 135–145)

## 2018-03-27 NOTE — H&P (Signed)
Chief Complaint: Recurrently infected epidermoid cyst of the right buttock cheek  History of Present Illness:  Paula Richard is an 66 y.o. female seen in the office back in January at [facility] with a recurrent poorly infected epidermoid cyst of the right buttock cheek.  She has a punctum in the center and presents today for excision.  Past Medical History:  Diagnosis Date  . Anxiety   . Arthritis    back, knees  . Cyst of buttocks 02/2018   right  . Dental crowns present   . FSGS (focal segmental glomerulosclerosis)    states currently in remission  . GERD (gastroesophageal reflux disease)   . History of Bell's palsy   . History of shingles   . Hypertension    states under control with med., has been on med. x 30 yr.  . Insulin dependent diabetes mellitus ([place])   . Neuropathy    feet  . Seasonal allergies     Past Surgical History:  Procedure Laterality Date  . ABDOMINAL HYSTERECTOMY     complete  . CARPAL TUNNEL RELEASE Bilateral   . FOOT SURGERY Bilateral   . NASAL SEPTUM SURGERY    . STERIOD INJECTION Left 05/23/2017   Procedure: LEFT TRIGGER FINGER INJECTION;  Surgeon: Leandrew Koyanagi, MD;  Location: [facility];  Service: Orthopedics;  Laterality: Left;  . TONSILLECTOMY    . TRIGGER FINGER RELEASE Right 05/23/2017   Procedure: RIGHT RING FINGER RELEASE TRIGGER FINGER AND TENOLYSIS, LEFT RING FINGER TRIGGER FINGER INJECTION;  Surgeon: Leandrew Koyanagi, MD;  Location: [facility];  Service: Orthopedics;  Laterality: Right;  . TRIGGER FINGER RELEASE Left 12/26/2017   Procedure: RELEASE TRIGGER FINGER LEFT RING FINGER;  Surgeon: Leandrew Koyanagi, MD;  Location: [facility];  Service: Orthopedics;  Laterality: Left;    No current facility-administered medications for this encounter.    Current Outpatient Medications  Medication Sig Dispense Refill  . aspirin 81 MG tablet Take 81 mg by mouth daily.    Marland Kitchen atorvastatin (LIPITOR) 40 MG  tablet Take 40 mg by mouth daily.    . cholecalciferol (VITAMIN D) 1000 units tablet Take 1,000 Units by mouth daily.    Marland Kitchen estradiol (ESTRACE) 1 MG tablet Take 1 mg by mouth daily.    . fexofenadine (ALLEGRA) 180 MG tablet Take 180 mg by mouth daily.    Marland Kitchen gabapentin (NEURONTIN) 600 MG tablet Take 600 mg by mouth 2 (two) times daily.    Marland Kitchen gabapentin (NEURONTIN) 600 MG tablet Take 1,200 mg by mouth at bedtime.    . Insulin Glargine (TOUJEO SOLOSTAR [place]) Inject 50 Units into the skin at bedtime.    . Insulin Glulisine (APIDRA [place]) Inject into the skin 3 (three) times daily before meals.    . metFORMIN (GLUCOPHAGE) 1000 MG tablet Take 1,000 mg by mouth daily after supper.    . metFORMIN (GLUCOPHAGE) 500 MG tablet Take 500 mg by mouth daily with breakfast.    . pantoprazole (PROTONIX) 20 MG tablet Take 20 mg by mouth daily.    . propranolol (INDERAL) 80 MG tablet Take 80 mg by mouth 2 (two) times daily.     . traMADol-acetaminophen (ULTRACET) 37.5-325 MG tablet TK 1 T PO  BID PRN P  0  . venlafaxine (EFFEXOR) 75 MG tablet Take 75 mg by mouth 2 (two) times daily.     Patient has no known allergies. History reviewed. No pertinent family history. Social History:  reports that she has never smoked. She has never used smokeless tobacco. She reports that she does not drink alcohol or use drugs.   REVIEW OF SYSTEMS : Negative except for see problem list   Physical Exam:   Height '5\' 3"'  (1.6 m), weight 68 kg (150 lb). Body mass index is 26.57 kg/m.  Gen:  [id number]  Neurological: Alert and oriented to person, place, and time. Motor and sensory function is grossly intact  Head: Normocephalic and atraumatic.  Eyes: Conjunctivae are normal. Pupils are equal, round, and reactive to light. No scleral icterus.  Neck: Normal range of motion. Neck supple. No tracheal deviation or thyromegaly present.  Cardiovascular:  SR without murmurs or gallops.  No carotid bruits Breast: Not examined Respiratory:  Effort normal.  No respiratory distress. No chest wall tenderness. Breath sounds normal.  No wheezes, rales or rhonchi.  Abdomen: Nontender GU: Chronic recurrent sebaceous cyst of the right buttock cheek Musculoskeletal: Normal range of motion. Extremities are nontender. No cyanosis, edema or clubbing noted Lymphadenopathy: No cervical, preauricular, postauricular or axillary adenopathy is present Skin: Skin is warm and dry. No rash noted. No diaphoresis. No erythema. No pallor. Pscyh: Normal mood and affect. Behavior is normal. Judgment and thought content normal.   LABORATORY RESULTS: Results for orders placed or performed during the hospital encounter of 03/29/18 (from the past 48 hour(s))  Basic metabolic panel     Status: Abnormal   Collection Time: 03/26/18  3:16 PM  Result Value Ref Range   Sodium 136 135 - 145 mmol/L   Potassium 4.6 3.5 - 5.1 mmol/L   Chloride 100 (L) 101 - 111 mmol/L   CO2 29 22 - 32 mmol/L   Glucose, Bld 195 (H) 65 - 99 mg/dL   BUN 12 6 - 20 mg/dL   Creatinine, Ser 0.88 0.44 - 1.00 mg/dL   Calcium 9.6 8.9 - 10.3 mg/dL   GFR calc non Af Amer >60 >60 mL/min   GFR calc Af Amer >60 >60 mL/min    Comment: (NOTE) The eGFR has been calculated using the CKD EPI equation. This calculation has not been validated in all clinical situations. eGFR's persistently <60 mL/min signify possible Chronic Kidney Disease.    Anion gap 7 5 - 15    Comment: Performed at [facility] [address], [place], [place] [address]     RADIOLOGY RESULTS: No results found.  Problem List: Patient Active Problem List   Diagnosis Date Noted  . Unilateral primary osteoarthritis, left knee 05/08/2017  . Trigger finger, left ring finger 05/08/2017  . Trigger finger, right ring finger 10/23/2016  . Other chest pain 04/14/2015  . Type 2 diabetes mellitus without complication ([place]) 81/18/8677  . Hyperlipidemia 04/14/2015  . Essential hypertension 04/14/2015  . Epigastric pain  04/14/2015  . Palpitations 04/14/2015    Assessment & Plan: Recurrent sebaceous cyst of the buttock cheek for excision    Matt B. Hassell Done, MD, [facility] [phone] beeper [phone]  03/27/2018 8:22 PM

## 2018-03-29 ENCOUNTER — Ambulatory Visit (HOSPITAL_BASED_OUTPATIENT_CLINIC_OR_DEPARTMENT_OTHER)
Admission: RE | Admit: 2018-03-29 | Discharge: 2018-03-29 | Disposition: A | Discharge status: Home / Self Care | Payer: Medicare Other | Source: Ambulatory Visit | Attending: Surgery | Admitting: Surgery

## 2018-03-29 ENCOUNTER — Encounter (HOSPITAL_BASED_OUTPATIENT_CLINIC_OR_DEPARTMENT_OTHER): Payer: Self-pay | Admitting: Emergency Medicine

## 2018-03-29 ENCOUNTER — Ambulatory Visit (HOSPITAL_BASED_OUTPATIENT_CLINIC_OR_DEPARTMENT_OTHER): Payer: Medicare Other | Admitting: Certified Registered Nurse Anesthetist

## 2018-03-29 ENCOUNTER — Encounter (HOSPITAL_BASED_OUTPATIENT_CLINIC_OR_DEPARTMENT_OTHER)
Admission: RE | Disposition: A | Discharge status: Home / Self Care | Payer: Self-pay | Source: Ambulatory Visit | Attending: Surgery

## 2018-03-29 ENCOUNTER — Other Ambulatory Visit: Payer: Self-pay

## 2018-03-29 DIAGNOSIS — K219 Gastro-esophageal reflux disease without esophagitis: Secondary | ICD-10-CM | POA: Diagnosis not present

## 2018-03-29 DIAGNOSIS — L720 Epidermal cyst: Secondary | ICD-10-CM | POA: Diagnosis not present

## 2018-03-29 DIAGNOSIS — L723 Sebaceous cyst: Secondary | ICD-10-CM | POA: Diagnosis not present

## 2018-03-29 DIAGNOSIS — I10 Essential (primary) hypertension: Secondary | ICD-10-CM | POA: Insufficient documentation

## 2018-03-29 DIAGNOSIS — E1140 Type 2 diabetes mellitus with diabetic neuropathy, unspecified: Secondary | ICD-10-CM | POA: Diagnosis not present

## 2018-03-29 DIAGNOSIS — E785 Hyperlipidemia, unspecified: Secondary | ICD-10-CM | POA: Diagnosis not present

## 2018-03-29 DIAGNOSIS — Z7982 Long term (current) use of aspirin: Secondary | ICD-10-CM | POA: Diagnosis not present

## 2018-03-29 DIAGNOSIS — M170 Bilateral primary osteoarthritis of knee: Secondary | ICD-10-CM | POA: Diagnosis not present

## 2018-03-29 DIAGNOSIS — Z79899 Other long term (current) drug therapy: Secondary | ICD-10-CM | POA: Diagnosis not present

## 2018-03-29 DIAGNOSIS — L0231 Cutaneous abscess of buttock: Secondary | ICD-10-CM | POA: Diagnosis not present

## 2018-03-29 DIAGNOSIS — M479 Spondylosis, unspecified: Secondary | ICD-10-CM | POA: Diagnosis not present

## 2018-03-29 DIAGNOSIS — E119 Type 2 diabetes mellitus without complications: Secondary | ICD-10-CM | POA: Diagnosis not present

## 2018-03-29 DIAGNOSIS — F419 Anxiety disorder, unspecified: Secondary | ICD-10-CM | POA: Diagnosis not present

## 2018-03-29 DIAGNOSIS — Z794 Long term (current) use of insulin: Secondary | ICD-10-CM | POA: Diagnosis not present

## 2018-03-29 HISTORY — DX: Personal history of other infectious and parasitic diseases: Z86.19

## 2018-03-29 HISTORY — DX: Long term (current) use of insulin: Z79.4

## 2018-03-29 HISTORY — PX: MASS EXCISION: SHX2000

## 2018-03-29 HISTORY — DX: Follicular cyst of the skin and subcutaneous tissue, unspecified: L72.9

## 2018-03-29 HISTORY — DX: Personal history of other diseases of the nervous system and sense organs: Z86.69

## 2018-03-29 HISTORY — DX: Reserved for inherently not codable concepts without codable children: IMO0001

## 2018-03-29 HISTORY — DX: Type 2 diabetes mellitus without complications: E11.9

## 2018-03-29 HISTORY — DX: Dental restoration status: Z98.811

## 2018-03-29 LAB — GLUCOSE, CAPILLARY
Glucose-Capillary: 158 mg/dL — ABNORMAL HIGH (ref 65–99)
Glucose-Capillary: 113 mg/dL — ABNORMAL HIGH (ref 65–99)

## 2018-03-29 SURGERY — EXCISION MASS
Anesthesia: Monitor Anesthesia Care | Site: Buttocks | Laterality: Right

## 2018-03-29 MED ORDER — SCOPOLAMINE 1 MG/3DAYS TD PT72: 1.0000 | MEDICATED_PATCH | Freq: Once | TRANSDERMAL | Status: DC | PRN

## 2018-03-29 MED ORDER — FENTANYL CITRATE (PF) 100 MCG/2ML IJ SOLN: 25.0000 ug | INTRAMUSCULAR | Status: DC | PRN

## 2018-03-29 MED ORDER — MIDAZOLAM HCL 2 MG/2ML IJ SOLN
INTRAMUSCULAR | Status: DC | PRN
  Administered 2018-03-29: 2 mg via INTRAVENOUS

## 2018-03-29 MED ORDER — DEXAMETHASONE SODIUM PHOSPHATE 10 MG/ML IJ SOLN
INTRAMUSCULAR | Status: DC | PRN
  Administered 2018-03-29: 10 mg via INTRAVENOUS

## 2018-03-29 MED ORDER — LIDOCAINE HCL (CARDIAC) PF 100 MG/5ML IV SOSY
PREFILLED_SYRINGE | INTRAVENOUS | Status: DC | PRN
  Administered 2018-03-29: 40 mg via INTRAVENOUS

## 2018-03-29 MED ORDER — ONDANSETRON HCL 4 MG/2ML IJ SOLN: 4.0000 mg | Freq: Once | INTRAMUSCULAR | Status: DC | PRN

## 2018-03-29 MED ORDER — LACTATED RINGERS IV SOLN
INTRAVENOUS | Status: DC
  Administered 2018-03-29: 11:00:00 via INTRAVENOUS

## 2018-03-29 MED ORDER — MIDAZOLAM HCL 2 MG/2ML IJ SOLN
INTRAMUSCULAR | Status: AC
  Filled 2018-03-29: qty 2

## 2018-03-29 MED ORDER — BUPIVACAINE-EPINEPHRINE 0.5% -1:200000 IJ SOLN
INTRAMUSCULAR | Status: DC | PRN
  Administered 2018-03-29: 10 mL

## 2018-03-29 MED ORDER — LIDOCAINE HCL (CARDIAC) PF 100 MG/5ML IV SOSY
PREFILLED_SYRINGE | INTRAVENOUS | Status: AC
  Filled 2018-03-29: qty 5

## 2018-03-29 MED ORDER — FENTANYL CITRATE (PF) 100 MCG/2ML IJ SOLN
INTRAMUSCULAR | Status: AC
  Filled 2018-03-29: qty 2

## 2018-03-29 MED ORDER — PROPOFOL 500 MG/50ML IV EMUL
INTRAVENOUS | Status: AC
Start: 2018-03-29 — End: ?
  Filled 2018-03-29: qty 50

## 2018-03-29 MED ORDER — FENTANYL CITRATE (PF) 250 MCG/5ML IJ SOLN
INTRAMUSCULAR | Status: DC | PRN
  Administered 2018-03-29 (×2): 25 ug via INTRAVENOUS

## 2018-03-29 MED ORDER — OXYCODONE HCL 5 MG/5ML PO SOLN: 5.0000 mg | Freq: Once | ORAL | Status: DC | PRN

## 2018-03-29 MED ORDER — MIDAZOLAM HCL 2 MG/2ML IJ SOLN: 1.0000 mg | INTRAMUSCULAR | Status: DC | PRN

## 2018-03-29 MED ORDER — FENTANYL CITRATE (PF) 100 MCG/2ML IJ SOLN: 50.0000 ug | INTRAMUSCULAR | Status: DC | PRN

## 2018-03-29 MED ORDER — PROPOFOL 10 MG/ML IV BOLUS
INTRAVENOUS | Status: DC | PRN
  Administered 2018-03-29 (×2): 20 mg via INTRAVENOUS

## 2018-03-29 MED ORDER — OXYCODONE HCL 5 MG PO TABS: 5.0000 mg | ORAL_TABLET | Freq: Once | ORAL | Status: DC | PRN

## 2018-03-29 MED ORDER — HYDROCODONE-ACETAMINOPHEN 5-325 MG PO TABS: 1.0000 | ORAL_TABLET | Freq: Four times a day (QID) | ORAL | 0 refills | Status: DC | PRN

## 2018-03-29 MED ORDER — ONDANSETRON HCL 4 MG/2ML IJ SOLN
INTRAMUSCULAR | Status: DC | PRN
  Administered 2018-03-29: 4 mg via INTRAVENOUS

## 2018-03-29 MED ORDER — ONDANSETRON HCL 4 MG/2ML IJ SOLN
INTRAMUSCULAR | Status: AC
  Filled 2018-03-29: qty 2

## 2018-03-29 MED ORDER — DEXAMETHASONE SODIUM PHOSPHATE 10 MG/ML IJ SOLN
INTRAMUSCULAR | Status: AC
  Filled 2018-03-29: qty 1

## 2018-03-29 SURGICAL SUPPLY — 49 items
ADH SKN CLS APL DERMABOND .7 (GAUZE/BANDAGES/DRESSINGS) ×1
APL SKNCLS STERI-STRIP NONHPOA (GAUZE/BANDAGES/DRESSINGS)
BENZOIN TINCTURE PRP APPL 2/3 (GAUZE/BANDAGES/DRESSINGS) IMPLANT
BLADE CLIPPER SURG (BLADE) IMPLANT
BLADE SURG 15 STRL LF DISP TIS (BLADE) ×1 IMPLANT
BLADE SURG 15 STRL SS (BLADE) ×2
CANISTER SUCT 1200ML W/VALVE (MISCELLANEOUS) IMPLANT
CLEANER CAUTERY TIP 5X5 PAD (MISCELLANEOUS) ×1 IMPLANT
COVER BACK TABLE 60X90IN (DRAPES) ×2 IMPLANT
COVER MAYO STAND STRL (DRAPES) ×2 IMPLANT
DECANTER SPIKE VIAL GLASS SM (MISCELLANEOUS) IMPLANT
DERMABOND ADVANCED (GAUZE/BANDAGES/DRESSINGS) ×1
DERMABOND ADVANCED .7 DNX12 (GAUZE/BANDAGES/DRESSINGS) IMPLANT
DRAPE LAPAROTOMY 100X72 PEDS (DRAPES) IMPLANT
DRAPE U-SHAPE 76X120 STRL (DRAPES) IMPLANT
DRSG TEGADERM 2-3/8X2-3/4 SM (GAUZE/BANDAGES/DRESSINGS) IMPLANT
ELECT REM PT RETURN 9FT ADLT (ELECTROSURGICAL) ×2
ELECTRODE REM PT RTRN 9FT ADLT (ELECTROSURGICAL) ×1 IMPLANT
GAUZE SPONGE 4X4 12PLY STRL LF (GAUZE/BANDAGES/DRESSINGS) ×2 IMPLANT
GLOVE BIO SURGEON STRL SZ8 (GLOVE) ×2 IMPLANT
GOWN STRL REUS W/ TWL LRG LVL3 (GOWN DISPOSABLE) ×1 IMPLANT
GOWN STRL REUS W/ TWL XL LVL3 (GOWN DISPOSABLE) ×1 IMPLANT
GOWN STRL REUS W/TWL LRG LVL3 (GOWN DISPOSABLE) ×2
GOWN STRL REUS W/TWL XL LVL3 (GOWN DISPOSABLE) ×2
NDL HYPO 25X1 1.5 SAFETY (NEEDLE) ×1 IMPLANT
NDL PRECISIONGLIDE 27X1.5 (NEEDLE) IMPLANT
NEEDLE HYPO 25X1 1.5 SAFETY (NEEDLE) ×2 IMPLANT
NEEDLE PRECISIONGLIDE 27X1.5 (NEEDLE) IMPLANT
NS IRRIG 1000ML POUR BTL (IV SOLUTION) IMPLANT
PACK BASIN DAY SURGERY FS (CUSTOM PROCEDURE TRAY) ×2 IMPLANT
PAD CLEANER CAUTERY TIP 5X5 (MISCELLANEOUS) ×1
PENCIL BUTTON HOLSTER BLD 10FT (ELECTRODE) ×2 IMPLANT
SPONGE GAUZE 2X2 8PLY STRL LF (GAUZE/BANDAGES/DRESSINGS) ×4 IMPLANT
STRIP CLOSURE SKIN 1/2X4 (GAUZE/BANDAGES/DRESSINGS) IMPLANT
SUT ETHILON 3 0 FSL (SUTURE) IMPLANT
SUT ETHILON 5 0 PS 2 18 (SUTURE) IMPLANT
SUT MNCRL AB 4-0 PS2 18 (SUTURE) IMPLANT
SUT VIC AB 3-0 SH 27 (SUTURE)
SUT VIC AB 3-0 SH 27X BRD (SUTURE) IMPLANT
SUT VIC AB 4-0 SH 18 (SUTURE) ×2 IMPLANT
SUT VIC AB 5-0 PS2 18 (SUTURE) IMPLANT
SUT VICRYL 3-0 CR8 SH (SUTURE) IMPLANT
SYR BULB 3OZ (MISCELLANEOUS) ×2 IMPLANT
SYR CONTROL 10ML LL (SYRINGE) ×2 IMPLANT
TOWEL OR 17X24 6PK STRL BLUE (TOWEL DISPOSABLE) ×2 IMPLANT
TRAY DSU PREP LF (CUSTOM PROCEDURE TRAY) ×2 IMPLANT
TUBE CONNECTING 20X1/4 (TUBING) IMPLANT
UNDERPAD 30X30 (UNDERPADS AND DIAPERS) IMPLANT
YANKAUER SUCT BULB TIP NO VENT (SUCTIONS) IMPLANT

## 2018-03-29 NOTE — Anesthesia Preprocedure Evaluation (Signed)
Anesthesia Evaluation  Patient identified by MRN, date of birth, ID band Patient awake    Reviewed: Allergy & Precautions, NPO status , Patient's Chart, lab work & pertinent test results  Airway Mallampati: II  TM Distance: >3 FB Neck ROM: Full    Dental  (+) Teeth Intact, Dental Advisory Given   Pulmonary    breath sounds clear to auscultation       Cardiovascular hypertension,  Rhythm:Regular Rate:Normal     Neuro/Psych    GI/Hepatic   Endo/Other  diabetes  Renal/GU      Musculoskeletal   Abdominal   Peds  Hematology   Anesthesia Other Findings   Reproductive/Obstetrics                             Anesthesia Physical Anesthesia Plan  ASA: III  Anesthesia Plan: MAC   Post-op Pain Management:    Induction: Intravenous  PONV Risk Score and Plan: Ondansetron and Propofol infusion  Airway Management Planned: Natural Airway and Simple Face Mask  Additional Equipment:   Intra-op Plan:   Post-operative Plan:   Informed Consent: I have reviewed the patients History and Physical, chart, labs and discussed the procedure including the risks, benefits and alternatives for the proposed anesthesia with the patient or authorized representative who has indicated his/her understanding and acceptance.     Dental advisory given  Plan Discussed with: CRNA and Anesthesiologist  Anesthesia Plan Comments:         Anesthesia Quick Evaluation  

## 2018-03-29 NOTE — Transfer of Care (Signed)
Immediate Anesthesia Transfer of Care Note  Patient: Paula Richard  Procedure(s) Performed: EXCISION OF RIGHT BUTTOCK CYST (Right Buttocks)  Patient Location: PACU  Anesthesia Type:MAC  Level of Consciousness: awake, alert , oriented and patient cooperative  Airway & Oxygen Therapy: Patient Spontanous Breathing  Post-op Assessment: Report given to RN and Post -op Vital signs reviewed and stable  Post vital signs: Reviewed and stable  Last Vitals:  Vitals Value Taken Time  BP 115/63 03/29/2018  1:35 PM  Temp    Pulse 62 03/29/2018  1:38 PM  Resp 15 03/29/2018  1:38 PM  SpO2 99 % 03/29/2018  1:38 PM  Vitals shown include unvalidated device data.  Last Pain:  Vitals:   03/29/18 1055  TempSrc: Oral  PainSc: 0-No pain         Complications: No apparent anesthesia complications

## 2018-03-29 NOTE — Op Note (Signed)
Paula Richard  1951-07-13 _0 @   PCP:  Velna Hatchet, MD   Surgeon: Kaylyn Lim, MD, FACS  Asst:  none  Anes:  Local (10cc) and MAC  Preop Dx: Recurrent sebaceous cyst of right buttock crease Postop Dx: same  Procedure: Excision and primary closure of sebaceous cyst remnant Location Surgery: CDS # 8 Complications: none  EBL:   minimal cc  Drains: none  Description of Procedure:  The patient was taken to OR 8 .  After anesthesia was administered and the patient was prepped a timeout was performed.  Area infiltrated with 1% marcaine with epi.  An ellipse of skin including the previously marked punctum was excised sharply.  The resultant defect was closed with three sutures of 4-0 vicryl and Dermabond.    The patient tolerated the procedure well and was taken to the PACU in stable condition.     Matt B. Hassell Done, [place], [facility], [place]

## 2018-03-29 NOTE — Anesthesia Postprocedure Evaluation (Signed)
Anesthesia Post Note  Patient: Paula Richard  Procedure(s) Performed: EXCISION OF RIGHT BUTTOCK CYST (Right Buttocks)     Patient location during evaluation: PACU Anesthesia Type: MAC Level of consciousness: awake and alert Pain management: pain level controlled Vital Signs Assessment: post-procedure vital signs reviewed and stable Respiratory status: spontaneous breathing, nonlabored ventilation, respiratory function stable and patient connected to nasal cannula oxygen Cardiovascular status: blood pressure returned to baseline and stable Postop Assessment: no apparent nausea or vomiting Anesthetic complications: no    Last Vitals:  Vitals:   03/29/18 1350 03/29/18 1411  BP:  (!) 157/87  Pulse: (!) 59 64  Resp: 12 16  Temp:  36.6 C  SpO2: 98% 98%    Last Pain:  Vitals:   03/29/18 1411  TempSrc: Oral  PainSc: 0-No pain                 Dola Lunsford COKER

## 2018-03-29 NOTE — Discharge Instructions (Signed)
May shower and bath tomorrow.        Post Anesthesia Home Care Instructions  Activity: Get plenty of rest for the remainder of the day. A responsible individual must stay with you for 24 hours following the procedure.  For the next 24 hours, DO NOT: -Drive a car -Paediatric nurse -Drink alcoholic beverages -Take any medication unless instructed by your physician -Make any legal decisions or sign important papers.  Meals: Start with liquid foods such as gelatin or soup. Progress to regular foods as tolerated. Avoid greasy, spicy, heavy foods. If nausea and/or vomiting occur, drink only clear liquids until the nausea and/or vomiting subsides. Call your physician if vomiting continues.  Special Instructions/Symptoms: Your throat may feel dry or sore from the anesthesia or the breathing tube placed in your throat during surgery. If this causes discomfort, gargle with warm salt water. The discomfort should disappear within 24 hours.  If you had a scopolamine patch placed behind your ear for the management of post- operative nausea and/or vomiting:  1. The medication in the patch is effective for 72 hours, after which it should be removed.  Wrap patch in a tissue and discard in the trash. Wash hands thoroughly with soap and water. 2. You may remove the patch earlier than 72 hours if you experience unpleasant side effects which may include dry mouth, dizziness or visual disturbances. 3. Avoid touching the patch. Wash your hands with soap and water after contact with the patch.

## 2018-03-29 NOTE — Interval H&P Note (Signed)
History and Physical Interval Note:  03/29/2018 1:06 PM  Paula Richard  has presented today for surgery, with the diagnosis of cyst of right buttock  The various methods of treatment have been discussed with the patient and family. After consideration of risks, benefits and other options for treatment, the patient has consented to  Procedure(s): EXCISION OF RIGHT BUTTOCK CYST (Right) as a surgical intervention .  The patient's history has been reviewed, patient examined, no change in status, stable for surgery.  I have reviewed the patient's chart and labs.  Questions were answered to the patient's satisfaction.     Pedro Earls

## 2018-04-01 ENCOUNTER — Encounter (HOSPITAL_BASED_OUTPATIENT_CLINIC_OR_DEPARTMENT_OTHER): Payer: Self-pay | Admitting: Surgery

## 2018-04-10 DIAGNOSIS — E1165 Type 2 diabetes mellitus with hyperglycemia: Secondary | ICD-10-CM | POA: Diagnosis not present

## 2018-04-10 DIAGNOSIS — I10 Essential (primary) hypertension: Secondary | ICD-10-CM | POA: Diagnosis not present

## 2018-04-10 DIAGNOSIS — Z6828 Body mass index (BMI) 28.0-28.9, adult: Secondary | ICD-10-CM | POA: Diagnosis not present

## 2018-06-27 ENCOUNTER — Other Ambulatory Visit: Payer: Self-pay

## 2018-06-27 DIAGNOSIS — L820 Inflamed seborrheic keratosis: Secondary | ICD-10-CM | POA: Diagnosis not present

## 2018-06-27 DIAGNOSIS — L821 Other seborrheic keratosis: Secondary | ICD-10-CM | POA: Diagnosis not present

## 2018-06-27 DIAGNOSIS — L570 Actinic keratosis: Secondary | ICD-10-CM | POA: Diagnosis not present

## 2018-06-27 DIAGNOSIS — D485 Neoplasm of uncertain behavior of skin: Secondary | ICD-10-CM | POA: Diagnosis not present

## 2018-07-15 DIAGNOSIS — Z1231 Encounter for screening mammogram for malignant neoplasm of breast: Secondary | ICD-10-CM | POA: Diagnosis not present

## 2018-07-25 DIAGNOSIS — R922 Inconclusive mammogram: Secondary | ICD-10-CM | POA: Diagnosis not present

## 2018-07-31 DIAGNOSIS — E1165 Type 2 diabetes mellitus with hyperglycemia: Secondary | ICD-10-CM | POA: Diagnosis not present

## 2018-07-31 DIAGNOSIS — E7849 Other hyperlipidemia: Secondary | ICD-10-CM | POA: Diagnosis not present

## 2018-07-31 DIAGNOSIS — R82998 Other abnormal findings in urine: Secondary | ICD-10-CM | POA: Diagnosis not present

## 2018-07-31 DIAGNOSIS — I10 Essential (primary) hypertension: Secondary | ICD-10-CM | POA: Diagnosis not present

## 2018-08-05 DIAGNOSIS — E559 Vitamin D deficiency, unspecified: Secondary | ICD-10-CM | POA: Diagnosis not present

## 2018-08-05 DIAGNOSIS — Z23 Encounter for immunization: Secondary | ICD-10-CM | POA: Diagnosis not present

## 2018-08-06 DIAGNOSIS — H2513 Age-related nuclear cataract, bilateral: Secondary | ICD-10-CM | POA: Diagnosis not present

## 2018-08-06 DIAGNOSIS — E119 Type 2 diabetes mellitus without complications: Secondary | ICD-10-CM | POA: Diagnosis not present

## 2018-08-06 DIAGNOSIS — H43813 Vitreous degeneration, bilateral: Secondary | ICD-10-CM | POA: Diagnosis not present

## 2018-08-06 DIAGNOSIS — H25013 Cortical age-related cataract, bilateral: Secondary | ICD-10-CM | POA: Diagnosis not present

## 2018-08-06 DIAGNOSIS — E109 Type 1 diabetes mellitus without complications: Secondary | ICD-10-CM | POA: Diagnosis not present

## 2018-08-27 ENCOUNTER — Ambulatory Visit (INDEPENDENT_AMBULATORY_CARE_PROVIDER_SITE_OTHER): Payer: Medicare Other

## 2018-08-27 ENCOUNTER — Encounter (INDEPENDENT_AMBULATORY_CARE_PROVIDER_SITE_OTHER): Payer: Self-pay | Admitting: Orthopaedic Surgery

## 2018-08-27 ENCOUNTER — Ambulatory Visit (INDEPENDENT_AMBULATORY_CARE_PROVIDER_SITE_OTHER): Payer: Medicare Other | Admitting: Orthopaedic Surgery

## 2018-08-27 VITALS — Ht 63.0 in | Wt 153.6 lb

## 2018-08-27 DIAGNOSIS — M542 Cervicalgia: Secondary | ICD-10-CM

## 2018-08-27 DIAGNOSIS — M5412 Radiculopathy, cervical region: Secondary | ICD-10-CM

## 2018-08-27 DIAGNOSIS — M25511 Pain in right shoulder: Secondary | ICD-10-CM | POA: Diagnosis not present

## 2018-08-27 NOTE — Progress Notes (Signed)
Office Visit Note   Patient: Paula Richard           Date of Birth: 1951-07-04           MRN: [id number] Visit Date: 08/27/2018              Requested by: Velna Hatchet, MD [address] [place], [place] [address] PCP: Velna Hatchet, MD   Assessment & Plan: Visit Diagnoses:  1. Acute pain of right shoulder   2. Cervical radiculopathy   3. Cervicalgia     Plan: I think [name]'s symptoms are more consistent with cervical radiculopathy especially since is improved by neck flexion.  Since she has been dealing with this for quite some time I do recommend an MRI to evaluate for structural abnormalities.  I think the main problem is going to be at C5-6 and C6-7.  Patient with the results and likely get her scheduled for an ESI with Dr. Ernestina Patches.  Follow-Up Instructions: Return if symptoms worsen or fail to improve.   Orders:  Orders Placed This Encounter  Procedures  . XR Shoulder Right  . XR Cervical Spine 2 or 3 views  . MR Cervical Spine w/o contrast   No orders of the defined types were placed in this encounter.     Procedures: No procedures performed   Clinical Data: No additional findings.   Subjective: Chief Complaint  Patient presents with  . Right Shoulder - Pain    Paula Richard is a 67 year old female comes in with right shoulder pain and numbness of her right thumb.  She denies any injuries.  She states that when she flexes her neck down this makes her symptoms better.  She has been dealing with this for many years and has seen her primary care doctor for this.  She is a brittle diabetic therefore she cannot take prednisone.   Review of Systems  Constitutional: Negative.   HENT: Negative.   Eyes: Negative.   Respiratory: Negative.   Cardiovascular: Negative.   Endocrine: Negative.   Musculoskeletal: Negative.   Neurological: Negative.   Hematological: Negative.   Psychiatric/Behavioral: Negative.   All other systems reviewed and are  negative.    Objective: Vital Signs: Ht 5\' 3"  (1.6 m)   Wt 153 lb 9.6 oz (69.7 kg)   BMI 27.21 kg/m   Physical Exam  Constitutional: She is oriented to person, place, and time. She appears well-developed and well-nourished.  Pulmonary/Chest: Effort normal.  Neurological: She is alert and oriented to person, place, and time.  Skin: Skin is warm. Capillary refill takes less than 2 seconds.  Psychiatric: She has a normal mood and affect. Her behavior is normal. Judgment and thought content normal.  Nursing note and vitals reviewed.   Ortho Exam Right shoulder exam is not overly impressive for any focal findings or provocative test. Positive Spurling sign. Specialty Comments:  No specialty comments available.  Imaging: Xr Cervical Spine 2 Or 3 Views  Result Date: 08/27/2018 Severe degenerative disc disease with anterior spurring of C5-6, C6-7.  Xr Shoulder Right  Result Date: 08/27/2018 No acute or structural abnormalities    PMFS History: Patient Active Problem List   Diagnosis Date Noted  . Unilateral primary osteoarthritis, left knee 05/08/2017  . Trigger finger, left ring finger 05/08/2017  . Trigger finger, right ring finger 10/23/2016  . Other chest pain 04/14/2015  . Type 2 diabetes mellitus without complication ([place]) 93/79/0240  . Hyperlipidemia 04/14/2015  . Essential hypertension 04/14/2015  . Epigastric pain  04/14/2015  . Palpitations 04/14/2015   Past Medical History:  Diagnosis Date  . Anxiety   . Arthritis    back, knees  . Cyst of buttocks 02/2018   right  . Dental crowns present   . FSGS (focal segmental glomerulosclerosis)    states currently in remission  . GERD (gastroesophageal reflux disease)   . History of Bell's palsy   . History of shingles   . Hypertension    states under control with med., has been on med. x 30 yr.  . Insulin dependent diabetes mellitus ([place])   . Neuropathy    feet  . Seasonal allergies     No family history  on file.  Past Surgical History:  Procedure Laterality Date  . ABDOMINAL HYSTERECTOMY     complete  . CARPAL TUNNEL RELEASE Bilateral   . FOOT SURGERY Bilateral   . MASS EXCISION Right 03/29/2018   Procedure: EXCISION OF RIGHT BUTTOCK CYST;  Surgeon: Johnathan Hausen, MD;  Location: [facility];  Service: General;  Laterality: Right;  . NASAL SEPTUM SURGERY    . STERIOD INJECTION Left 05/23/2017   Procedure: LEFT TRIGGER FINGER INJECTION;  Surgeon: Leandrew Koyanagi, MD;  Location: [facility];  Service: Orthopedics;  Laterality: Left;  . TONSILLECTOMY    . TRIGGER FINGER RELEASE Right 05/23/2017   Procedure: RIGHT RING FINGER RELEASE TRIGGER FINGER AND TENOLYSIS, LEFT RING FINGER TRIGGER FINGER INJECTION;  Surgeon: Leandrew Koyanagi, MD;  Location: [facility];  Service: Orthopedics;  Laterality: Right;  . TRIGGER FINGER RELEASE Left 12/26/2017   Procedure: RELEASE TRIGGER FINGER LEFT RING FINGER;  Surgeon: Leandrew Koyanagi, MD;  Location: [facility];  Service: Orthopedics;  Laterality: Left;   Social History   Occupational History  . Not on file  Tobacco Use  . Smoking status: Never Smoker  . Smokeless tobacco: Never Used  Substance and Sexual Activity  . Alcohol use: No  . Drug use: No  . Sexual activity: Not on file

## 2018-09-08 ENCOUNTER — Ambulatory Visit
Admission: RE | Admit: 2018-09-08 | Discharge: 2018-09-08 | Disposition: A | Discharge status: Home / Self Care | Payer: Medicare Other | Source: Ambulatory Visit | Attending: Orthopaedic Surgery | Admitting: Orthopaedic Surgery

## 2018-09-08 DIAGNOSIS — M542 Cervicalgia: Secondary | ICD-10-CM

## 2018-09-08 DIAGNOSIS — R200 Anesthesia of skin: Secondary | ICD-10-CM | POA: Diagnosis not present

## 2018-09-12 ENCOUNTER — Encounter (INDEPENDENT_AMBULATORY_CARE_PROVIDER_SITE_OTHER): Payer: Self-pay | Admitting: Orthopaedic Surgery

## 2018-09-12 ENCOUNTER — Ambulatory Visit (INDEPENDENT_AMBULATORY_CARE_PROVIDER_SITE_OTHER): Payer: Medicare Other | Admitting: Orthopaedic Surgery

## 2018-09-12 ENCOUNTER — Other Ambulatory Visit (INDEPENDENT_AMBULATORY_CARE_PROVIDER_SITE_OTHER): Payer: Self-pay | Admitting: Orthopaedic Surgery

## 2018-09-12 DIAGNOSIS — M5412 Radiculopathy, cervical region: Secondary | ICD-10-CM

## 2018-09-12 DIAGNOSIS — M542 Cervicalgia: Secondary | ICD-10-CM | POA: Diagnosis not present

## 2018-09-12 MED ORDER — METHOCARBAMOL 500 MG PO TABS: 500.0000 mg | ORAL_TABLET | Freq: Four times a day (QID) | ORAL | 2 refills | Status: DC | PRN

## 2018-09-12 MED ORDER — CYCLOBENZAPRINE HCL 5 MG PO TABS: 5.0000 mg | ORAL_TABLET | Freq: Three times a day (TID) | ORAL | 3 refills | Status: DC | PRN

## 2018-09-12 MED ORDER — TIZANIDINE HCL 4 MG PO TABS: 4.0000 mg | ORAL_TABLET | Freq: Four times a day (QID) | ORAL | 2 refills | Status: DC | PRN

## 2018-09-12 MED ORDER — MELOXICAM 7.5 MG PO TABS: 7.5000 mg | ORAL_TABLET | Freq: Two times a day (BID) | ORAL | 2 refills | Status: DC | PRN

## 2018-09-12 NOTE — Progress Notes (Signed)
Office Visit Note   Patient: Paula Richard           Date of Birth: 25-Feb-1951           MRN: [id number] Visit Date: 09/12/2018              Requested by: Velna Hatchet, MD [address] [place], [place] [address] PCP: Velna Hatchet, MD   Assessment & Plan: Visit Diagnoses:  1. Cervical radiculopathy   2. Cervicalgia     Plan: Impression is diffuse cervical spondylosis and foraminal stenosis at multiple levels.  We will refer patient to Dr. Ernestina Patches for consideration of cervical spine ESI.  Follow-up as needed.  Follow-Up Instructions: Return if symptoms worsen or fail to improve.   Orders:  No orders of the defined types were placed in this encounter.  Meds ordered this encounter  Medications  . methocarbamol (ROBAXIN) 500 MG tablet    Sig: Take 1 tablet (500 mg total) by mouth every 6 (six) hours as needed for muscle spasms.    Dispense:  30 tablet    Refill:  2  . meloxicam (MOBIC) 7.5 MG tablet    Sig: Take 1 tablet (7.5 mg total) by mouth 2 (two) times daily as needed for pain.    Dispense:  30 tablet    Refill:  2  . cyclobenzaprine (FLEXERIL) 5 MG tablet    Sig: Take 1-2 tablets (5-10 mg total) by mouth 3 (three) times daily as needed for muscle spasms.    Dispense:  30 tablet    Refill:  3  . tiZANidine (ZANAFLEX) 4 MG tablet    Sig: Take 1 tablet (4 mg total) by mouth every 6 (six) hours as needed for muscle spasms.    Dispense:  30 tablet    Refill:  2      Procedures: No procedures performed   Clinical Data: No additional findings.   Subjective: Chief Complaint  Patient presents with  . Neck - Pain, Follow-up    Paula Richard comes in today for MRI review of her cervical spine.  She continues to have chronic neck pain and significant radicular pain into her right biceps region and constant right hand numbness.   Review of Systems  Constitutional: Negative.   HENT: Negative.   Eyes: Negative.   Respiratory: Negative.   Cardiovascular:  Negative.   Endocrine: Negative.   Musculoskeletal: Negative.   Neurological: Negative.   Hematological: Negative.   Psychiatric/Behavioral: Negative.   All other systems reviewed and are negative.    Objective: Vital Signs: There were no vitals taken for this visit.  Physical Exam  Constitutional: She is oriented to person, place, and time. She appears well-developed and well-nourished.  Pulmonary/Chest: Effort normal.  Neurological: She is alert and oriented to person, place, and time.  Skin: Skin is warm. Capillary refill takes less than 2 seconds.  Psychiatric: She has a normal mood and affect. Her behavior is normal. Judgment and thought content normal.  Nursing note and vitals reviewed.   Ortho Exam She has no focal weakness of C5-T1. Specialty Comments:  No specialty comments available.  Imaging: No results found.   PMFS History: Patient Active Problem List   Diagnosis Date Noted  . Unilateral primary osteoarthritis, left knee 05/08/2017  . Trigger finger, left ring finger 05/08/2017  . Trigger finger, right ring finger 10/23/2016  . Other chest pain 04/14/2015  . Type 2 diabetes mellitus without complication ([place]) 97/35/3299  . Hyperlipidemia 04/14/2015  . Essential  hypertension 04/14/2015  . Epigastric pain 04/14/2015  . Palpitations 04/14/2015   Past Medical History:  Diagnosis Date  . Anxiety   . Arthritis    back, knees  . Cyst of buttocks 02/2018   right  . Dental crowns present   . FSGS (focal segmental glomerulosclerosis)    states currently in remission  . GERD (gastroesophageal reflux disease)   . History of Bell's palsy   . History of shingles   . Hypertension    states under control with med., has been on med. x 30 yr.  . Insulin dependent diabetes mellitus ([place])   . Neuropathy    feet  . Seasonal allergies     History reviewed. No pertinent family history.  Past Surgical History:  Procedure Laterality Date  . ABDOMINAL  HYSTERECTOMY     complete  . CARPAL TUNNEL RELEASE Bilateral   . FOOT SURGERY Bilateral   . MASS EXCISION Right 03/29/2018   Procedure: EXCISION OF RIGHT BUTTOCK CYST;  Surgeon: Johnathan Hausen, MD;  Location: [facility];  Service: General;  Laterality: Right;  . NASAL SEPTUM SURGERY    . STERIOD INJECTION Left 05/23/2017   Procedure: LEFT TRIGGER FINGER INJECTION;  Surgeon: Leandrew Koyanagi, MD;  Location: [facility];  Service: Orthopedics;  Laterality: Left;  . TONSILLECTOMY    . TRIGGER FINGER RELEASE Right 05/23/2017   Procedure: RIGHT RING FINGER RELEASE TRIGGER FINGER AND TENOLYSIS, LEFT RING FINGER TRIGGER FINGER INJECTION;  Surgeon: Leandrew Koyanagi, MD;  Location: [place];  Service: Orthopedics;  Laterality: Right;  . TRIGGER FINGER RELEASE Left 12/26/2017   Procedure: RELEASE TRIGGER FINGER LEFT RING FINGER;  Surgeon: Leandrew Koyanagi, MD;  Location: [place];  Service: Orthopedics;  Laterality: Left;   Social History   Occupational History  . Not on file  Tobacco Use  . Smoking status: Never Smoker  . Smokeless tobacco: Never Used  Substance and Sexual Activity  . Alcohol use: No  . Drug use: No  . Sexual activity: Not on file

## 2018-09-12 NOTE — Addendum Note (Signed)
Addended by: Precious Bard on: 09/12/2018 11:26 AM   Modules accepted: Orders

## 2018-09-16 ENCOUNTER — Telehealth (INDEPENDENT_AMBULATORY_CARE_PROVIDER_SITE_OTHER): Payer: Self-pay | Admitting: Orthopaedic Surgery

## 2018-09-16 DIAGNOSIS — M5412 Radiculopathy, cervical region: Secondary | ICD-10-CM

## 2018-09-16 NOTE — Telephone Encounter (Signed)
I put in order for cervical ESI per Dr Phoebe Sharps last OV note.

## 2018-09-16 NOTE — Telephone Encounter (Signed)
Put in orders

## 2018-09-17 ENCOUNTER — Ambulatory Visit (INDEPENDENT_AMBULATORY_CARE_PROVIDER_SITE_OTHER): Payer: Medicare Other | Admitting: Orthopaedic Surgery

## 2018-09-24 DIAGNOSIS — Z23 Encounter for immunization: Secondary | ICD-10-CM | POA: Diagnosis not present

## 2018-09-27 ENCOUNTER — Encounter (INDEPENDENT_AMBULATORY_CARE_PROVIDER_SITE_OTHER): Payer: Self-pay | Admitting: Physical Medicine and Rehabilitation

## 2018-09-27 ENCOUNTER — Ambulatory Visit (INDEPENDENT_AMBULATORY_CARE_PROVIDER_SITE_OTHER): Payer: Medicare Other | Admitting: Physical Medicine and Rehabilitation

## 2018-09-27 VITALS — BP 123/59 | HR 67

## 2018-09-27 DIAGNOSIS — F411 Generalized anxiety disorder: Secondary | ICD-10-CM

## 2018-09-27 DIAGNOSIS — M5010 Cervical disc disorder with radiculopathy, unspecified cervical region: Secondary | ICD-10-CM | POA: Diagnosis not present

## 2018-09-27 DIAGNOSIS — M5412 Radiculopathy, cervical region: Secondary | ICD-10-CM | POA: Diagnosis not present

## 2018-09-27 DIAGNOSIS — M4802 Spinal stenosis, cervical region: Secondary | ICD-10-CM | POA: Diagnosis not present

## 2018-09-27 MED ORDER — DIAZEPAM 5 MG PO TABS: ORAL_TABLET | ORAL | 0 refills | Status: DC

## 2018-09-27 NOTE — Progress Notes (Signed)
  Numeric Pain Rating Scale and Functional Assessment Average Pain 6 Pain Right Now 5 My pain is constant, dull and aching Pain is worse with: some activites Pain improves with: medication   In the last MONTH (on 0-10 scale) has pain interfered with the following?  1. General activity like being  able to carry out your everyday physical activities such as walking, climbing stairs, carrying groceries, or moving a chair?  Rating(6)  2. Relation with others like being able to carry out your usual social activities and roles such as  activities at home, at work and in your community. Rating(6)  3. Enjoyment of life such that you have  been bothered by emotional problems such as feeling anxious, depressed or irritable?  Rating(7)

## 2018-09-30 ENCOUNTER — Ambulatory Visit (INDEPENDENT_AMBULATORY_CARE_PROVIDER_SITE_OTHER): Payer: Self-pay

## 2018-09-30 ENCOUNTER — Ambulatory Visit (INDEPENDENT_AMBULATORY_CARE_PROVIDER_SITE_OTHER): Payer: Medicare Other | Admitting: Physical Medicine and Rehabilitation

## 2018-09-30 ENCOUNTER — Encounter (INDEPENDENT_AMBULATORY_CARE_PROVIDER_SITE_OTHER): Payer: Self-pay | Admitting: Physical Medicine and Rehabilitation

## 2018-09-30 VITALS — BP 136/72 | HR 65 | Temp 98.4°F

## 2018-09-30 DIAGNOSIS — M5412 Radiculopathy, cervical region: Secondary | ICD-10-CM | POA: Diagnosis not present

## 2018-09-30 MED ORDER — METHYLPREDNISOLONE ACETATE 80 MG/ML IJ SUSP
80.0000 mg | Freq: Once | INTRAMUSCULAR | Status: AC
Start: 2018-09-30 — End: 2018-09-30
  Administered 2018-09-30: 80 mg

## 2018-09-30 NOTE — Procedures (Signed)
Cervical Epidural Steroid Injection - Interlaminar Approach with Fluoroscopic Guidance  Patient: Paula Richard      Date of Birth: [birth year] MRN: [id number] PCP: Velna Hatchet, MD      Visit Date: 09/30/2018   Universal Protocol:    Date/Time: 11/04/194:55 PM  Consent Given By: the patient  Position: PRONE  Additional Comments: Vital signs were monitored before and after the procedure. Patient was prepped and draped in the usual sterile fashion. The correct patient, procedure, and site was verified.   Injection Procedure Details:  Procedure Site One Meds Administered:  Meds ordered this encounter  Medications  . methylPREDNISolone acetate (DEPO-MEDROL) injection 80 mg     Laterality: Right  Location/Site: C7-T1  Needle size: 20 G  Needle type: Touhy  Needle Placement: Paramedian epidural space  Findings:  -Comments: Excellent flow of contrast into the epidural space.  Procedure Details: Using a paramedian approach from the side mentioned above, the region overlying the inferior lamina was localized under fluoroscopic visualization and the soft tissues overlying this structure were infiltrated with 4 ml. of 1% Lidocaine without Epinephrine. A # 20 gauge, Tuohy needle was inserted into the epidural space using a paramedian approach.  The epidural space was localized using loss of resistance along with lateral and contralateral oblique bi-planar fluoroscopic views.  After negative aspirate for air, blood, and CSF, a 2 ml. volume of Isovue-250 was injected into the epidural space and the flow of contrast was observed. Radiographs were obtained for documentation purposes.   The injectate was administered into the level noted above.  Additional Comments:  The patient tolerated the procedure well Dressing: Band-Aid    Post-procedure details: Patient was observed during the procedure. Post-procedure instructions were reviewed.  Patient left the clinic in stable  condition.

## 2018-09-30 NOTE — Progress Notes (Signed)
 .  Numeric Pain Rating Scale and Functional Assessment Average Pain 5   In the last MONTH (on 0-10 scale) has pain interfered with the following?  1. General activity like being  able to carry out your everyday physical activities such as walking, climbing stairs, carrying groceries, or moving a chair?  Rating(4)   +Driver, -BT, -Dye Allergies.  

## 2018-09-30 NOTE — Patient Instructions (Signed)

## 2018-10-10 ENCOUNTER — Telehealth (INDEPENDENT_AMBULATORY_CARE_PROVIDER_SITE_OTHER): Payer: Self-pay | Admitting: Physical Medicine and Rehabilitation

## 2018-10-10 NOTE — Telephone Encounter (Signed)
Called patient to advise. She has been and will continue using hydrocortisone topically and will let us know if this worsens or if she has any other concerns.

## 2018-10-10 NOTE — Progress Notes (Signed)
Paula Richard - 67 y.o. female MRN [id number]  Date of birth: 09-25-1951  Office Visit Note: Visit Date: 09/30/2018 PCP: Velna Hatchet, MD Referred by: Velna Hatchet, MD  Subjective: Chief Complaint  Patient presents with  . Neck - Pain  . Right Arm - Pain, Numbness, Tingling  . Right Hand - Numbness   HPI:  Paula Richard is a 67 y.o. female who comes in today For planned right C7-T1 interlaminar epidural steroid injection.  Please see our prior evaluation and management note for further details and justification.  ROS Otherwise per HPI.  Assessment & Plan: Visit Diagnoses:  1. Cervical radiculopathy     Plan: No additional findings.   Meds & Orders:  Meds ordered this encounter  Medications  . methylPREDNISolone acetate (DEPO-MEDROL) injection 80 mg    Orders Placed This Encounter  Procedures  . XR C-ARM NO REPORT  . Epidural Steroid injection    Follow-up: Return if symptoms worsen or fail to improve.   Procedures: No procedures performed  Cervical Epidural Steroid Injection - Interlaminar Approach with Fluoroscopic Guidance  Patient: Paula Richard      Date of Birth: 1951/11/27 MRN: [id number] PCP: Velna Hatchet, MD      Visit Date: 09/30/2018   Universal Protocol:    Date/Time: 11/04/194:55 PM  Consent Given By: the patient  Position: PRONE  Additional Comments: Vital signs were monitored before and after the procedure. Patient was prepped and draped in the usual sterile fashion. The correct patient, procedure, and site was verified.   Injection Procedure Details:  Procedure Site One Meds Administered:  Meds ordered this encounter  Medications  . methylPREDNISolone acetate (DEPO-MEDROL) injection 80 mg     Laterality: Right  Location/Site: C7-T1  Needle size: 20 G  Needle type: Touhy  Needle Placement: Paramedian epidural space  Findings:  -Comments: Excellent flow of contrast into the epidural space.  Procedure  Details: Using a paramedian approach from the side mentioned above, the region overlying the inferior lamina was localized under fluoroscopic visualization and the soft tissues overlying this structure were infiltrated with 4 ml. of 1% Lidocaine without Epinephrine. A # 20 gauge, Tuohy needle was inserted into the epidural space using a paramedian approach.  The epidural space was localized using loss of resistance along with lateral and contralateral oblique bi-planar fluoroscopic views.  After negative aspirate for air, blood, and CSF, a 2 ml. volume of Isovue-250 was injected into the epidural space and the flow of contrast was observed. Radiographs were obtained for documentation purposes.   The injectate was administered into the level noted above.  Additional Comments:  The patient tolerated the procedure well Dressing: Band-Aid    Post-procedure details: Patient was observed during the procedure. Post-procedure instructions were reviewed.  Patient left the clinic in stable condition.    Clinical History: MRI CERVICAL SPINE WITHOUT CONTRAST  TECHNIQUE: Multiplanar, multisequence MR imaging of the cervical spine was performed. No intravenous contrast was administered.  COMPARISON:  None.  FINDINGS: Alignment: Physiologic.  Vertebrae: No fracture, evidence of discitis, or bone lesion.  Cord: Normal signal and morphology.  Posterior Fossa, vertebral arteries, paraspinal tissues: None Posterior fossa demonstrates no focal abnormality. Vertebral artery flow voids are maintained. Paraspinal soft tissues are unremarkable.  Disc levels:  Discs: Degenerative disc disease with disc height loss at C5-6 and C6-7.  C2-3: No significant disc bulge. No neural foraminal stenosis. No central canal stenosis. Mild left facet arthropathy.  C3-4: No significant disc bulge. No  neural foraminal stenosis. No central canal stenosis. Moderate bilateral facet  arthropathy.  C4-5: Small central disc protrusion contacting the ventral cervical spinal cord. Moderate left facet arthropathy. No neural foraminal stenosis. No central canal stenosis.  C5-6: Broad-based disc bulge. Bilateral uncovertebral degenerative changes. Severe bilateral foraminal stenosis. No central canal stenosis.  C6-7: Broad-based disc bulge with a broad central disc protrusion contacting the ventral cervical spinal cord. Bilateral uncovertebral degenerative changes. Moderate-severe left foraminal stenosis. Mild right foraminal stenosis. Mild central canal stenosis.  C7-T1: No significant disc bulge. No neural foraminal stenosis. No central canal stenosis. Mild bilateral facet arthropathy.  T1-2: Mild broad-based disc bulge. Mild bilateral facet arthropathy. No foraminal stenosis.  T2-3: Mild broad-based disc bulge. No foraminal or central canal stenosis. Mild bilateral facet arthropathy.  IMPRESSION: 1. Diffuse cervical spine spondylosis as described above. 2. At C4-5 there is a small central disc protrusion contacting the ventral cervical spinal cord. Moderate left facet arthropathy. No neural foraminal stenosis. No central canal stenosis. 3. At C5-6 there is a broad-based disc bulge. Bilateral uncovertebral degenerative changes. Severe bilateral foraminal stenosis. 4. At C6-7 there is a broad-based disc bulge with a broad central disc protrusion contacting the ventral cervical spinal cord. Bilateral uncovertebral degenerative changes. Moderate-severe left foraminal stenosis. Mild right foraminal stenosis. Mild central canal stenosis.   Electronically Signed   By: Kathreen Devoid   On: 09/09/2018 08:13     Objective:  VS:  HT:    WT:   BMI:     BP:136/72  HR:65bpm  TEMP:98.4 F (36.9 C)(Oral)  RESP:  Physical Exam  Ortho Exam Imaging: No results found.

## 2018-10-10 NOTE — Telephone Encounter (Signed)
If the hives started just recently, not sure what it could be. Topically we use the chlorhexidine/alcohol and cold spray. Would try topical hydrocortisone and topical benadryl gel - both OTC and watch. Contrast agent would not have caused local reaction to injection site. We did not use any iodine.

## 2018-10-18 DIAGNOSIS — Z6827 Body mass index (BMI) 27.0-27.9, adult: Secondary | ICD-10-CM | POA: Diagnosis not present

## 2018-10-18 DIAGNOSIS — Z01419 Encounter for gynecological examination (general) (routine) without abnormal findings: Secondary | ICD-10-CM | POA: Diagnosis not present

## 2018-11-13 DIAGNOSIS — E559 Vitamin D deficiency, unspecified: Secondary | ICD-10-CM | POA: Diagnosis not present

## 2018-11-13 DIAGNOSIS — K219 Gastro-esophageal reflux disease without esophagitis: Secondary | ICD-10-CM | POA: Diagnosis not present

## 2018-11-13 DIAGNOSIS — E7849 Other hyperlipidemia: Secondary | ICD-10-CM | POA: Diagnosis not present

## 2018-11-13 DIAGNOSIS — Z794 Long term (current) use of insulin: Secondary | ICD-10-CM | POA: Diagnosis not present

## 2018-11-13 DIAGNOSIS — I10 Essential (primary) hypertension: Secondary | ICD-10-CM | POA: Diagnosis not present

## 2018-11-13 DIAGNOSIS — M797 Fibromyalgia: Secondary | ICD-10-CM | POA: Diagnosis not present

## 2018-11-13 DIAGNOSIS — E1165 Type 2 diabetes mellitus with hyperglycemia: Secondary | ICD-10-CM | POA: Diagnosis not present

## 2018-11-13 DIAGNOSIS — Z6826 Body mass index (BMI) 26.0-26.9, adult: Secondary | ICD-10-CM | POA: Diagnosis not present

## 2018-11-16 IMAGING — MR MR CERVICAL SPINE W/O CM
4 of 5 series · 27 of 48 positions shown · non-contrast
Comparison: None.

CLINICAL DATA: Right shoulder pain with numbness in the right
thumb. Denies any injury.

EXAM:
MRI CERVICAL SPINE WITHOUT CONTRAST
TECHNIQUE: Multiplanar, multisequence MR imaging of the cervical spine was
performed. No intravenous contrast was administered.

[Series 2: (id) tse sag · sagittal · 3.0mm · 0.41mm/px · 3 of 13 slices shown]
[im 3/13]
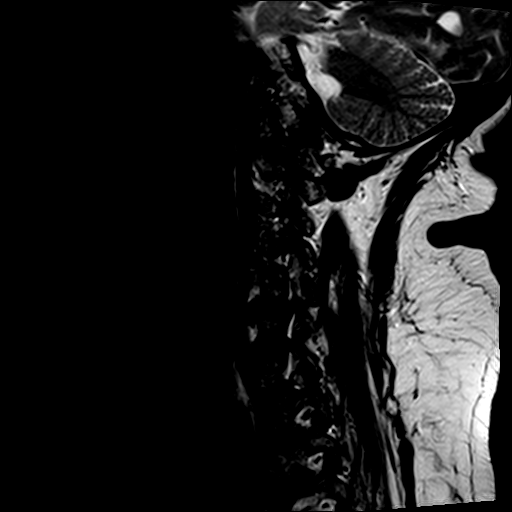
[im 8/13]
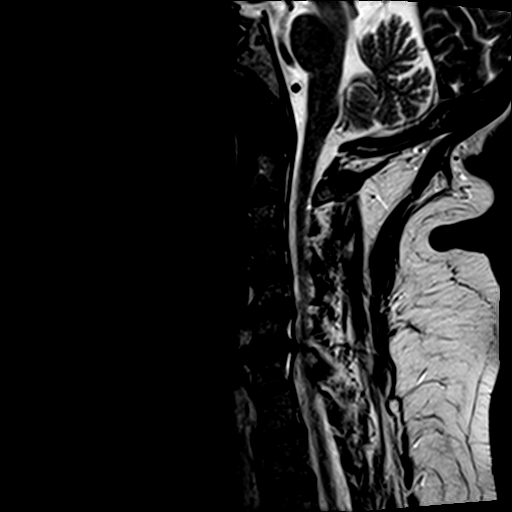
[im 13/13]
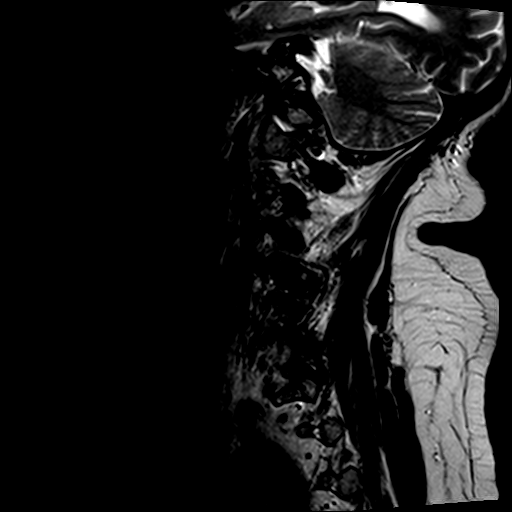

[Series 4: STIR · sagittal · 3.0mm · 0.82mm/px · 6 of 13 slices shown]
[im 1/13]
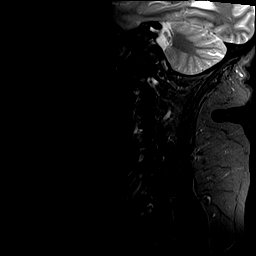
[im 3/13]
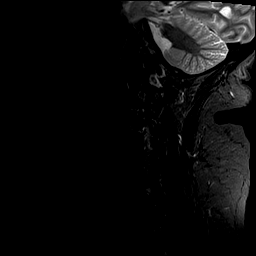
[im 5/13]
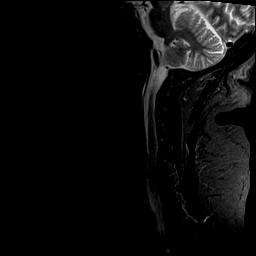
[im 8/13]
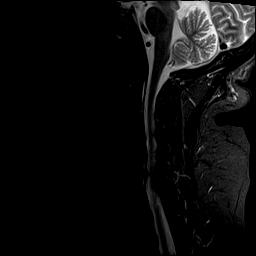
[im 10/13]
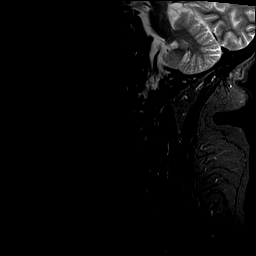
[im 13/13]
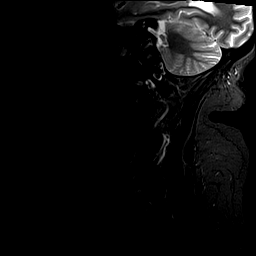

[Series 5: T2 · axial · 3.0mm · 0.39mm/px · z∈[-72,+24]mm · 9 of 30 slices shown (1 of 2)]
[im 1/30]
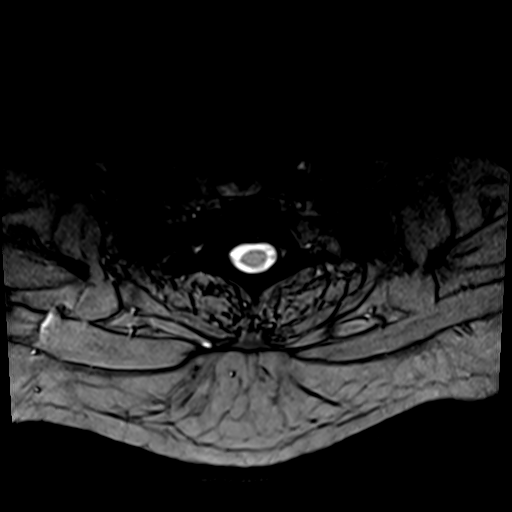
[im 5/30]
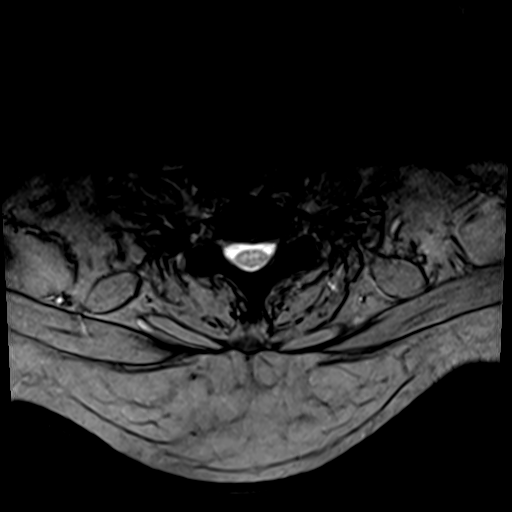
[im 9/30]
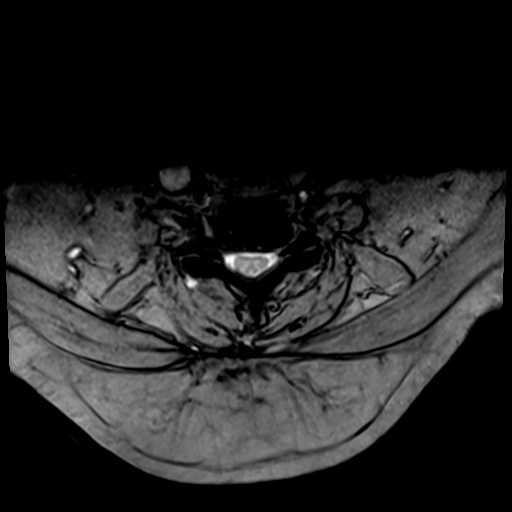
[im 13/30]
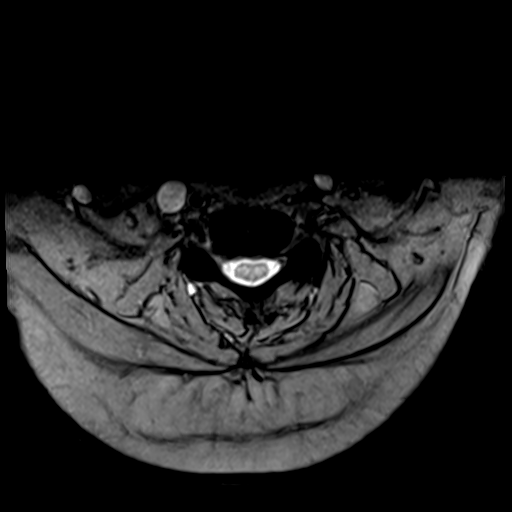
[im 15/30]
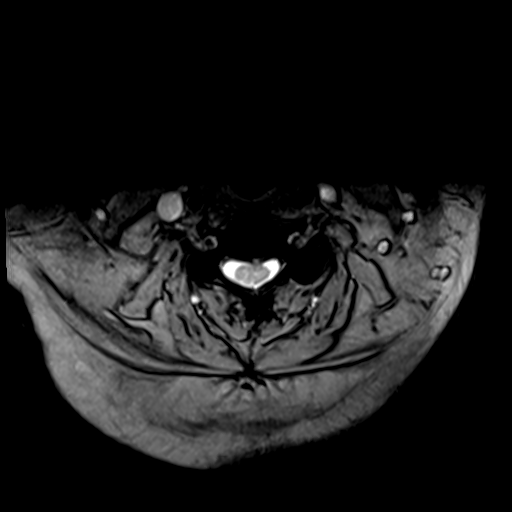
[im 17/30]
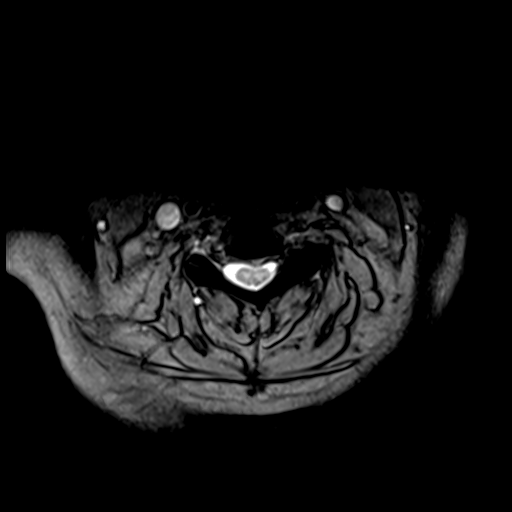
[im 21/30]
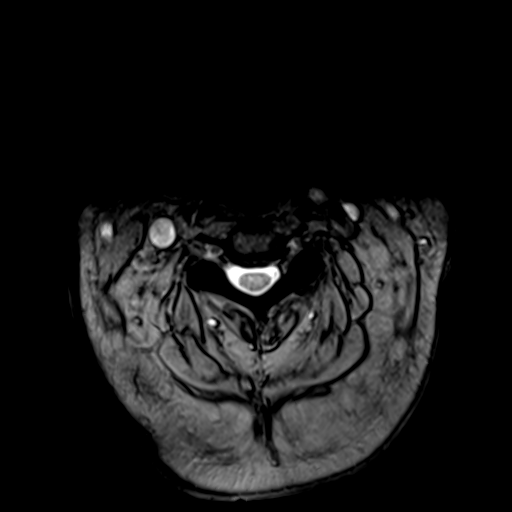
[im 25/30]
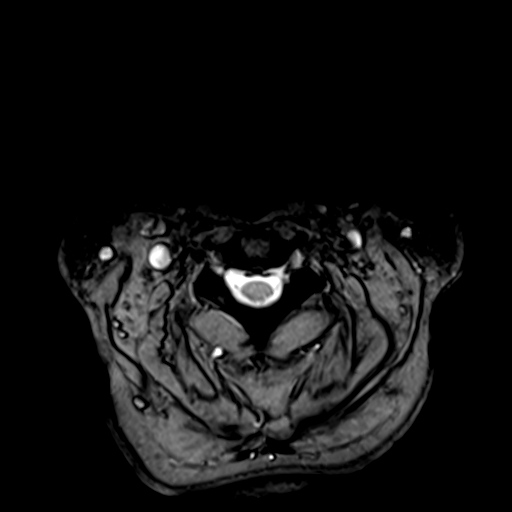
[im 30/30]
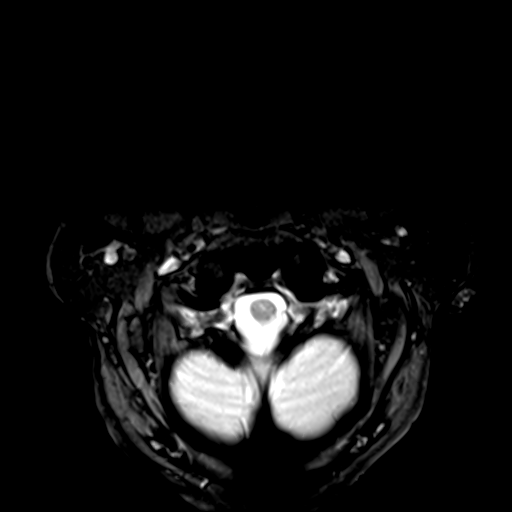

[Series 6: T2 · axial · 3.0mm · 0.62mm/px · z∈[-72,+24]mm · 9 of 30 slices shown (2 of 2)]
[im 1/30]
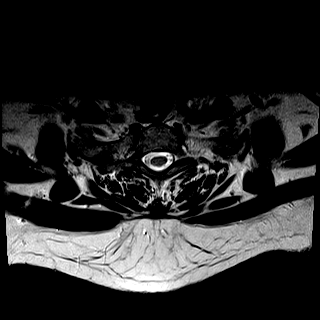
[im 5/30]
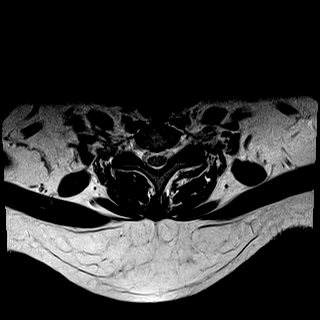
[im 9/30]
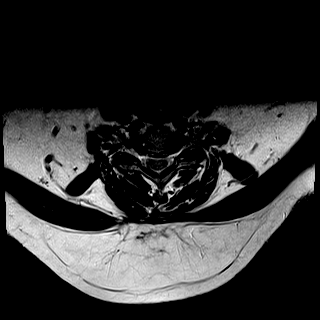
[im 13/30]
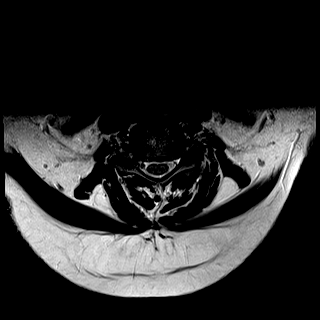
[im 15/30]
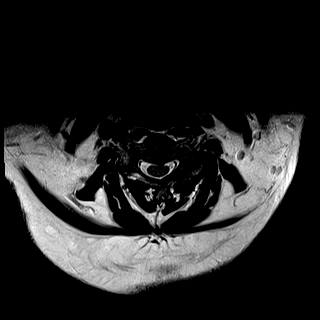
[im 17/30]
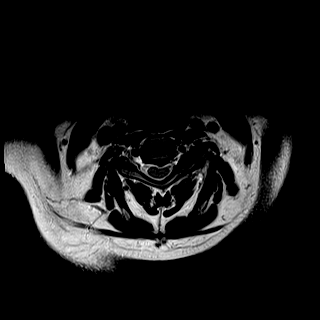
[im 21/30]
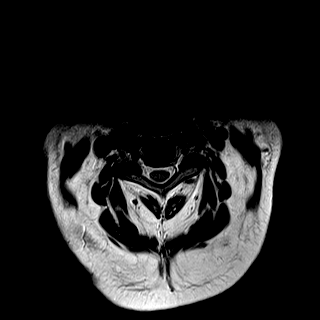
[im 25/30]
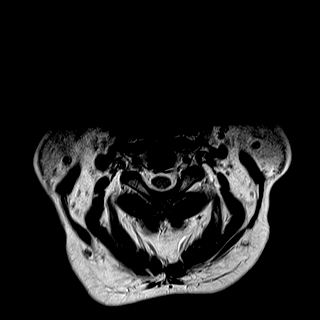
[im 30/30]
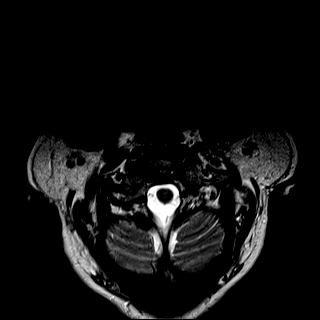

[27 of 48 positions shown; findings below may reference images not displayed]

FINDINGS: Alignment: Physiologic.

Vertebrae: No fracture, evidence of discitis, or bone lesion.

Cord: Normal signal and morphology.

Posterior Fossa, vertebral arteries, paraspinal tissues: None
Posterior fossa demonstrates no focal abnormality. Vertebral artery
flow voids are maintained. Paraspinal soft tissues are unremarkable.

Disc levels:

Discs: Degenerative disc disease with disc height loss at C5-6 and
C6-7.

C2-3: No significant disc bulge. No neural foraminal stenosis. No
central canal stenosis. Mild left facet arthropathy.

C3-4: No significant disc bulge. No neural foraminal stenosis. No
central canal stenosis. Moderate bilateral facet arthropathy.

C4-5: Small central disc protrusion contacting the ventral cervical
spinal cord. Moderate left facet arthropathy. No neural foraminal
stenosis. No central canal stenosis.

C5-6: Broad-based disc bulge. Bilateral uncovertebral degenerative
changes. Severe bilateral foraminal stenosis. No central canal
stenosis.

C6-7: Broad-based disc bulge with a broad central disc protrusion
contacting the ventral cervical spinal cord. Bilateral uncovertebral
degenerative changes. Moderate-severe left foraminal stenosis. Mild
right foraminal stenosis. Mild central canal stenosis.

C7-T1: No significant disc bulge. No neural foraminal stenosis. No
central canal stenosis. Mild bilateral facet arthropathy.

T1-2: Mild broad-based disc bulge. Mild bilateral facet arthropathy.
No foraminal stenosis.

T2-3: Mild broad-based disc bulge. No foraminal or central canal
stenosis. Mild bilateral facet arthropathy.
IMPRESSION: 1. Diffuse cervical spine spondylosis as described above.
2. At C4-5 there is a small central disc protrusion contacting the
ventral cervical spinal cord. Moderate left facet arthropathy. No
neural foraminal stenosis. No central canal stenosis.
3. At C5-6 there is a broad-based disc bulge. Bilateral
uncovertebral degenerative changes. Severe bilateral foraminal
stenosis.
4. At C6-7 there is a broad-based disc bulge with a broad central
disc protrusion contacting the ventral cervical spinal cord.
Bilateral uncovertebral degenerative changes. Moderate-severe left
foraminal stenosis. Mild right foraminal stenosis. Mild central
canal stenosis.

## 2019-02-11 DIAGNOSIS — M1990 Unspecified osteoarthritis, unspecified site: Secondary | ICD-10-CM | POA: Diagnosis not present

## 2019-02-11 DIAGNOSIS — I10 Essential (primary) hypertension: Secondary | ICD-10-CM | POA: Diagnosis not present

## 2019-02-11 DIAGNOSIS — R42 Dizziness and giddiness: Secondary | ICD-10-CM | POA: Diagnosis not present

## 2019-02-11 DIAGNOSIS — J302 Other seasonal allergic rhinitis: Secondary | ICD-10-CM | POA: Diagnosis not present

## 2019-02-11 DIAGNOSIS — M797 Fibromyalgia: Secondary | ICD-10-CM | POA: Diagnosis not present

## 2019-02-11 DIAGNOSIS — E1165 Type 2 diabetes mellitus with hyperglycemia: Secondary | ICD-10-CM | POA: Diagnosis not present

## 2019-02-26 ENCOUNTER — Ambulatory Visit (INDEPENDENT_AMBULATORY_CARE_PROVIDER_SITE_OTHER): Payer: Medicare Other | Admitting: Orthopaedic Surgery

## 2019-03-04 ENCOUNTER — Telehealth (INDEPENDENT_AMBULATORY_CARE_PROVIDER_SITE_OTHER): Payer: Self-pay

## 2019-03-04 NOTE — Telephone Encounter (Signed)
Called patient and asked the screening questions.  Do you have now or have you had in the past 7 days a fever and/or chills? NO  Do you have now or have you had in the past 7 days a cough? NO  Do you have now or have you had in the last 7 days nausea, vomiting or abdominal pain? NO  Have you been exposed to anyone who has tested positive for COVID-19? NO  Have you or anyone who lives with you traveled within the last month? NO 

## 2019-03-05 ENCOUNTER — Ambulatory Visit (INDEPENDENT_AMBULATORY_CARE_PROVIDER_SITE_OTHER): Payer: Medicare Other

## 2019-03-05 ENCOUNTER — Ambulatory Visit (INDEPENDENT_AMBULATORY_CARE_PROVIDER_SITE_OTHER): Payer: Medicare Other | Admitting: Orthopaedic Surgery

## 2019-03-05 ENCOUNTER — Encounter (INDEPENDENT_AMBULATORY_CARE_PROVIDER_SITE_OTHER): Payer: Self-pay | Admitting: Orthopaedic Surgery

## 2019-03-05 ENCOUNTER — Other Ambulatory Visit: Payer: Self-pay

## 2019-03-05 DIAGNOSIS — M65832 Other synovitis and tenosynovitis, left forearm: Secondary | ICD-10-CM

## 2019-03-05 DIAGNOSIS — M65932 Unspecified synovitis and tenosynovitis, left forearm: Secondary | ICD-10-CM | POA: Insufficient documentation

## 2019-03-05 DIAGNOSIS — M76891 Other specified enthesopathies of right lower limb, excluding foot: Secondary | ICD-10-CM | POA: Diagnosis not present

## 2019-03-05 DIAGNOSIS — M65332 Trigger finger, left middle finger: Secondary | ICD-10-CM | POA: Diagnosis not present

## 2019-03-05 MED ORDER — DICLOFENAC SODIUM 1 % TD GEL: 2.0000 g | Freq: Four times a day (QID) | TRANSDERMAL | 5 refills | Status: AC

## 2019-03-05 MED ORDER — METHYLPREDNISOLONE 4 MG PO TBPK: ORAL_TABLET | ORAL | 0 refills | Status: DC

## 2019-03-05 NOTE — Progress Notes (Signed)
Office Visit Note   Patient: Paula Richard           Date of Birth: [birth year]           MRN: [id number] Visit Date: 03/05/2019              Requested by: Velna Hatchet, MD [address] [place], [place] [address] PCP: Velna Hatchet, MD   Assessment & Plan: Visit Diagnoses:  1. Hamstring tendinitis of right thigh   2. Extensor tenosynovitis of left wrist   3. Trigger finger, left middle finger     Plan: In terms of all of her issues I would like to try Medrol Dosepak to see if this will calm everything down.  I did offer outpatient physical therapy but I certainly understand if she does not want to physical therapy right now given the current coronavirus environment.  She understands that this can take several months to even a year for it to feel better.  She has been dealing with this for about several months now.  In terms of the trigger finger and extensor tenosynovitis I have placed her in a wrist wrist brace to help immobilize her wrist.  Prescription for Medrol Dosepak and Voltaren gel.  Questions encouraged and answered.  Follow-up as needed.  Follow-Up Instructions: Return if symptoms worsen or fail to improve.   Orders:  Orders Placed This Encounter  Procedures  . XR HIP UNILAT W OR W/O PELVIS 2-3 VIEWS RIGHT   Meds ordered this encounter  Medications  . methylPREDNISolone (MEDROL DOSEPAK) 4 MG TBPK tablet    Sig: Use as directed    Dispense:  21 tablet    Refill:  0  . diclofenac sodium (VOLTAREN) 1 % GEL    Sig: Apply 2 g topically 4 (four) times daily.    Dispense:  1 Tube    Refill:  5      Procedures: No procedures performed   Clinical Data: No additional findings.   Subjective: Chief Complaint  Patient presents with  . Left Hand - Pain  . Right Hip - Pain  . Left Middle Finger - Pain    Paula Richard is a very pleasant 68 year old female who by no quite well who comes in with several issues.  The first issue is a left middle trigger finger  has been locking up on her motion when she uses it to pull weeds.  She is also complaining of dorsal left wrist swelling and pain that is worse with activity.  She denies any injuries.  She is also still been bothered by her right proximal hamstring tendinitis.  She is a moderately controlled diabetic.  She did well from a previous left ring trigger finger release.  She cannot take NSAIDs due to chronic kidney disease.   Review of Systems  Constitutional: Negative.   HENT: Negative.   Eyes: Negative.   Respiratory: Negative.   Cardiovascular: Negative.   Endocrine: Negative.   Musculoskeletal: Negative.   Neurological: Negative.   Hematological: Negative.   Psychiatric/Behavioral: Negative.   All other systems reviewed and are negative.    Objective: Vital Signs: There were no vitals taken for this visit.  Physical Exam Vitals signs and nursing note reviewed.  Constitutional:      Appearance: She is well-developed.  Pulmonary:     Effort: Pulmonary effort is normal.  Skin:    General: Skin is warm.     Capillary Refill: Capillary refill takes less than 2 seconds.  Neurological:  Mental Status: She is alert and oriented to person, place, and time.  Psychiatric:        Behavior: Behavior normal.        Thought Content: Thought content normal.        Judgment: Judgment normal.     Ortho Exam Right hip exam shows no tenderness at the trochanteric bursa and no pain in the groin with hip range of motion.  She has tenderness at the proximal hamstring insertion to the initial tuberosity.  There is no sciatic tension signs.  Left wrist exam shows mild to moderate swelling on the dorsum over the wrist overlying the fourth compartment of the extensor tendons.  There is no soft tissue crepitus.  No evidence of infection.  There is increased discomfort and pain with resisted wrist extension and finger extension.  Left hand exam shows tenderness at the A1 pulley in line with the  middle finger.  She has no significant triggering. Specialty Comments:  No specialty comments available.  Imaging: Xr Hip Unilat W Or W/o Pelvis 2-3 Views Right  Result Date: 03/05/2019 No acute or structural abnormalities    PMFS History: Patient Active Problem List   Diagnosis Date Noted  . Extensor tenosynovitis of left wrist 03/05/2019  . Trigger finger, left middle finger 03/05/2019  . Hamstring tendinitis of right thigh 03/05/2019  . Unilateral primary osteoarthritis, left knee 05/08/2017  . Trigger finger, left ring finger 05/08/2017  . Trigger finger, right ring finger 10/23/2016  . Other chest pain 04/14/2015  . Type 2 diabetes mellitus without complication ([place]) 96/78/9381  . Hyperlipidemia 04/14/2015  . Essential hypertension 04/14/2015  . Epigastric pain 04/14/2015  . Palpitations 04/14/2015   Past Medical History:  Diagnosis Date  . Anxiety   . Arthritis    back, knees  . Cyst of buttocks 02/2018   right  . Dental crowns present   . FSGS (focal segmental glomerulosclerosis)    states currently in remission  . GERD (gastroesophageal reflux disease)   . History of Bell's palsy   . History of shingles   . Hypertension    states under control with med., has been on med. x 30 yr.  . Insulin dependent diabetes mellitus ([place])   . Neuropathy    feet  . Seasonal allergies     History reviewed. No pertinent family history.  Past Surgical History:  Procedure Laterality Date  . ABDOMINAL HYSTERECTOMY     complete  . CARPAL TUNNEL RELEASE Bilateral   . FOOT SURGERY Bilateral   . MASS EXCISION Right 03/29/2018   Procedure: EXCISION OF RIGHT BUTTOCK CYST;  Surgeon: Johnathan Hausen, MD;  Location: [facility];  Service: General;  Laterality: Right;  . NASAL SEPTUM SURGERY    . STERIOD INJECTION Left 05/23/2017   Procedure: LEFT TRIGGER FINGER INJECTION;  Surgeon: Leandrew Koyanagi, MD;  Location: [facility];  Service: Orthopedics;   Laterality: Left;  . TONSILLECTOMY    . TRIGGER FINGER RELEASE Right 05/23/2017   Procedure: RIGHT RING FINGER RELEASE TRIGGER FINGER AND TENOLYSIS, LEFT RING FINGER TRIGGER FINGER INJECTION;  Surgeon: Leandrew Koyanagi, MD;  Location: [facility];  Service: Orthopedics;  Laterality: Right;  . TRIGGER FINGER RELEASE Left 12/26/2017   Procedure: RELEASE TRIGGER FINGER LEFT RING FINGER;  Surgeon: Leandrew Koyanagi, MD;  Location: [facility];  Service: Orthopedics;  Laterality: Left;   Social History   Occupational History  . Not on file  Tobacco  Use  . Smoking status: Never Smoker  . Smokeless tobacco: Never Used  Substance and Sexual Activity  . Alcohol use: No  . Drug use: No  . Sexual activity: Not on file

## 2019-05-09 DIAGNOSIS — E1165 Type 2 diabetes mellitus with hyperglycemia: Secondary | ICD-10-CM | POA: Diagnosis not present

## 2019-05-13 DIAGNOSIS — E1165 Type 2 diabetes mellitus with hyperglycemia: Secondary | ICD-10-CM | POA: Diagnosis not present

## 2019-05-13 DIAGNOSIS — E1140 Type 2 diabetes mellitus with diabetic neuropathy, unspecified: Secondary | ICD-10-CM | POA: Diagnosis not present

## 2019-05-13 DIAGNOSIS — I10 Essential (primary) hypertension: Secondary | ICD-10-CM | POA: Diagnosis not present

## 2019-05-13 DIAGNOSIS — R42 Dizziness and giddiness: Secondary | ICD-10-CM | POA: Diagnosis not present

## 2019-05-13 DIAGNOSIS — Z794 Long term (current) use of insulin: Secondary | ICD-10-CM | POA: Diagnosis not present

## 2019-05-20 DIAGNOSIS — R42 Dizziness and giddiness: Secondary | ICD-10-CM | POA: Diagnosis not present

## 2019-05-20 DIAGNOSIS — R262 Difficulty in walking, not elsewhere classified: Secondary | ICD-10-CM | POA: Diagnosis not present

## 2019-05-20 DIAGNOSIS — G510 Bell's palsy: Secondary | ICD-10-CM | POA: Diagnosis not present

## 2019-05-20 DIAGNOSIS — M6281 Muscle weakness (generalized): Secondary | ICD-10-CM | POA: Diagnosis not present

## 2019-05-23 DIAGNOSIS — M6281 Muscle weakness (generalized): Secondary | ICD-10-CM | POA: Diagnosis not present

## 2019-05-23 DIAGNOSIS — R262 Difficulty in walking, not elsewhere classified: Secondary | ICD-10-CM | POA: Diagnosis not present

## 2019-05-23 DIAGNOSIS — G510 Bell's palsy: Secondary | ICD-10-CM | POA: Diagnosis not present

## 2019-05-23 DIAGNOSIS — R42 Dizziness and giddiness: Secondary | ICD-10-CM | POA: Diagnosis not present

## 2019-06-04 ENCOUNTER — Other Ambulatory Visit: Payer: Self-pay

## 2019-06-04 ENCOUNTER — Encounter (HOSPITAL_BASED_OUTPATIENT_CLINIC_OR_DEPARTMENT_OTHER): Payer: Self-pay | Admitting: *Deleted

## 2019-06-06 ENCOUNTER — Other Ambulatory Visit: Payer: Self-pay

## 2019-06-06 ENCOUNTER — Encounter (HOSPITAL_BASED_OUTPATIENT_CLINIC_OR_DEPARTMENT_OTHER)
Admission: RE | Admit: 2019-06-06 | Discharge: 2019-06-06 | Disposition: A | Discharge status: Home / Self Care | Payer: Medicare Other | Source: Ambulatory Visit | Attending: Orthopaedic Surgery | Admitting: Orthopaedic Surgery

## 2019-06-06 DIAGNOSIS — Z1159 Encounter for screening for other viral diseases: Secondary | ICD-10-CM | POA: Diagnosis not present

## 2019-06-06 DIAGNOSIS — Z01818 Encounter for other preprocedural examination: Secondary | ICD-10-CM | POA: Insufficient documentation

## 2019-06-06 DIAGNOSIS — R9431 Abnormal electrocardiogram [ECG] [EKG]: Secondary | ICD-10-CM | POA: Insufficient documentation

## 2019-06-06 LAB — BASIC METABOLIC PANEL
Anion gap: 8 (ref 5–15)
BUN: 10 mg/dL (ref 8–23)
CO2: 27 mmol/L (ref 22–32)
Calcium: 9.4 mg/dL (ref 8.9–10.3)
Chloride: 102 mmol/L (ref 98–111)
Creatinine, Ser: 0.82 mg/dL (ref 0.44–1.00)
GFR calc Af Amer: >60 mL/min (ref >60)
GFR calc non Af Amer: >60 mL/min (ref >60)
Glucose, Bld: 254 mg/dL — ABNORMAL HIGH (ref 70–99)
Potassium: 5.1 mmol/L (ref 3.5–5.1)
Sodium: 137 mmol/L (ref 135–145)

## 2019-06-06 NOTE — Progress Notes (Signed)
Gatorade 2 drink given with instructions to complete by [facility], pt verbalized understanding.

## 2019-06-07 ENCOUNTER — Other Ambulatory Visit (HOSPITAL_COMMUNITY)
Admission: RE | Admit: 2019-06-07 | Discharge: 2019-06-07 | Disposition: A | Discharge status: Home / Self Care | Payer: Medicare Other | Source: Ambulatory Visit | Attending: Orthopaedic Surgery | Admitting: Orthopaedic Surgery

## 2019-06-07 DIAGNOSIS — R9431 Abnormal electrocardiogram [ECG] [EKG]: Secondary | ICD-10-CM | POA: Diagnosis not present

## 2019-06-07 DIAGNOSIS — Z1159 Encounter for screening for other viral diseases: Secondary | ICD-10-CM | POA: Diagnosis not present

## 2019-06-07 DIAGNOSIS — Z01818 Encounter for other preprocedural examination: Secondary | ICD-10-CM | POA: Diagnosis not present

## 2019-06-07 LAB — SARS CORONAVIRUS 2 (TAT 6-24 HRS): SARS Coronavirus 2: NEGATIVE

## 2019-06-11 ENCOUNTER — Encounter (HOSPITAL_BASED_OUTPATIENT_CLINIC_OR_DEPARTMENT_OTHER): Payer: Self-pay | Admitting: *Deleted

## 2019-06-11 ENCOUNTER — Ambulatory Visit (HOSPITAL_BASED_OUTPATIENT_CLINIC_OR_DEPARTMENT_OTHER)
Admission: RE | Admit: 2019-06-11 | Discharge: 2019-06-11 | Disposition: A | Discharge status: Home / Self Care | Payer: Medicare Other | Attending: Orthopaedic Surgery | Admitting: Orthopaedic Surgery

## 2019-06-11 ENCOUNTER — Ambulatory Visit (HOSPITAL_BASED_OUTPATIENT_CLINIC_OR_DEPARTMENT_OTHER): Payer: Medicare Other | Admitting: Certified Registered"

## 2019-06-11 ENCOUNTER — Encounter (HOSPITAL_BASED_OUTPATIENT_CLINIC_OR_DEPARTMENT_OTHER)
Admission: RE | Disposition: A | Discharge status: Home / Self Care | Payer: Self-pay | Source: Home / Self Care | Attending: Orthopaedic Surgery

## 2019-06-11 ENCOUNTER — Other Ambulatory Visit: Payer: Self-pay

## 2019-06-11 DIAGNOSIS — Z794 Long term (current) use of insulin: Secondary | ICD-10-CM | POA: Insufficient documentation

## 2019-06-11 DIAGNOSIS — M8949 Other hypertrophic osteoarthropathy, multiple sites: Secondary | ICD-10-CM | POA: Insufficient documentation

## 2019-06-11 DIAGNOSIS — Z791 Long term (current) use of non-steroidal anti-inflammatories (NSAID): Secondary | ICD-10-CM | POA: Diagnosis not present

## 2019-06-11 DIAGNOSIS — K219 Gastro-esophageal reflux disease without esophagitis: Secondary | ICD-10-CM | POA: Diagnosis not present

## 2019-06-11 DIAGNOSIS — F419 Anxiety disorder, unspecified: Secondary | ICD-10-CM | POA: Diagnosis not present

## 2019-06-11 DIAGNOSIS — Z79899 Other long term (current) drug therapy: Secondary | ICD-10-CM | POA: Diagnosis not present

## 2019-06-11 DIAGNOSIS — M65341 Trigger finger, right ring finger: Secondary | ICD-10-CM | POA: Diagnosis not present

## 2019-06-11 DIAGNOSIS — M65332 Trigger finger, left middle finger: Secondary | ICD-10-CM | POA: Insufficient documentation

## 2019-06-11 DIAGNOSIS — I10 Essential (primary) hypertension: Secondary | ICD-10-CM | POA: Diagnosis not present

## 2019-06-11 DIAGNOSIS — G629 Polyneuropathy, unspecified: Secondary | ICD-10-CM | POA: Insufficient documentation

## 2019-06-11 DIAGNOSIS — Z7982 Long term (current) use of aspirin: Secondary | ICD-10-CM | POA: Diagnosis not present

## 2019-06-11 DIAGNOSIS — M65342 Trigger finger, left ring finger: Secondary | ICD-10-CM | POA: Diagnosis not present

## 2019-06-11 DIAGNOSIS — E119 Type 2 diabetes mellitus without complications: Secondary | ICD-10-CM | POA: Insufficient documentation

## 2019-06-11 HISTORY — PX: TRIGGER FINGER RELEASE: SHX641

## 2019-06-11 LAB — GLUCOSE, CAPILLARY
Glucose-Capillary: 214 mg/dL — ABNORMAL HIGH (ref 70–99)
Glucose-Capillary: 204 mg/dL — ABNORMAL HIGH (ref 70–99)

## 2019-06-11 SURGERY — RELEASE, A1 PULLEY, FOR TRIGGER FINGER
Anesthesia: Regional | Site: Hand | Laterality: Left

## 2019-06-11 MED ORDER — LIDOCAINE HCL (PF) 0.5 % IJ SOLN
INTRAMUSCULAR | Status: DC | PRN
  Administered 2019-06-11: 35 mL via INTRAVENOUS

## 2019-06-11 MED ORDER — OXYCODONE HCL 5 MG/5ML PO SOLN: 5.0000 mg | Freq: Once | ORAL | Status: DC | PRN

## 2019-06-11 MED ORDER — BUPIVACAINE HCL (PF) 0.25 % IJ SOLN
INTRAMUSCULAR | Status: DC | PRN
  Administered 2019-06-11: 5 mL

## 2019-06-11 MED ORDER — CHLORHEXIDINE GLUCONATE 4 % EX LIQD: 60.0000 mL | Freq: Once | CUTANEOUS | Status: DC

## 2019-06-11 MED ORDER — OXYCODONE HCL 5 MG PO TABS: 5.0000 mg | ORAL_TABLET | Freq: Once | ORAL | Status: DC | PRN

## 2019-06-11 MED ORDER — LACTATED RINGERS IV SOLN
INTRAVENOUS | Status: DC
  Administered 2019-06-11: 08:00:00 via INTRAVENOUS

## 2019-06-11 MED ORDER — CEFAZOLIN SODIUM-DEXTROSE 2-4 GM/100ML-% IV SOLN
INTRAVENOUS | Status: AC
  Filled 2019-06-11: qty 100

## 2019-06-11 MED ORDER — DEXAMETHASONE SODIUM PHOSPHATE 10 MG/ML IJ SOLN
INTRAMUSCULAR | Status: AC
  Filled 2019-06-11: qty 1

## 2019-06-11 MED ORDER — ONDANSETRON HCL 4 MG/2ML IJ SOLN: 4.0000 mg | Freq: Once | INTRAMUSCULAR | Status: DC | PRN

## 2019-06-11 MED ORDER — MIDAZOLAM HCL 2 MG/2ML IJ SOLN
1.0000 mg | INTRAMUSCULAR | Status: DC | PRN
  Administered 2019-06-11: 08:00:00 2 mg via INTRAVENOUS

## 2019-06-11 MED ORDER — CEFAZOLIN SODIUM-DEXTROSE 2-4 GM/100ML-% IV SOLN: 2.0000 g | INTRAVENOUS | Status: DC

## 2019-06-11 MED ORDER — FENTANYL CITRATE (PF) 100 MCG/2ML IJ SOLN
50.0000 ug | INTRAMUSCULAR | Status: DC | PRN
  Administered 2019-06-11: 08:00:00 50 ug via INTRAVENOUS

## 2019-06-11 MED ORDER — PROPOFOL 500 MG/50ML IV EMUL
INTRAVENOUS | Status: AC
  Filled 2019-06-11: qty 50

## 2019-06-11 MED ORDER — PROPOFOL 500 MG/50ML IV EMUL
INTRAVENOUS | Status: DC | PRN
  Administered 2019-06-11: 75 ug/kg/min via INTRAVENOUS

## 2019-06-11 MED ORDER — MIDAZOLAM HCL 2 MG/2ML IJ SOLN
INTRAMUSCULAR | Status: AC
  Filled 2019-06-11: qty 2

## 2019-06-11 MED ORDER — FENTANYL CITRATE (PF) 100 MCG/2ML IJ SOLN: 25.0000 ug | INTRAMUSCULAR | Status: DC | PRN

## 2019-06-11 MED ORDER — FENTANYL CITRATE (PF) 100 MCG/2ML IJ SOLN
INTRAMUSCULAR | Status: AC
  Filled 2019-06-11: qty 2

## 2019-06-11 MED ORDER — SCOPOLAMINE 1 MG/3DAYS TD PT72: 1.0000 | MEDICATED_PATCH | Freq: Once | TRANSDERMAL | Status: DC

## 2019-06-11 MED ORDER — ONDANSETRON HCL 4 MG/2ML IJ SOLN
INTRAMUSCULAR | Status: DC | PRN
  Administered 2019-06-11: 4 mg via INTRAVENOUS

## 2019-06-11 MED ORDER — BUPIVACAINE HCL (PF) 0.25 % IJ SOLN
INTRAMUSCULAR | Status: AC
  Filled 2019-06-11: qty 30

## 2019-06-11 MED ORDER — HYDROCODONE-ACETAMINOPHEN 5-325 MG PO TABS: 1.0000 | ORAL_TABLET | Freq: Three times a day (TID) | ORAL | 0 refills | Status: DC | PRN

## 2019-06-11 MED ORDER — ONDANSETRON HCL 4 MG/2ML IJ SOLN
INTRAMUSCULAR | Status: AC
  Filled 2019-06-11: qty 2

## 2019-06-11 SURGICAL SUPPLY — 44 items
BANDAGE ACE 3X5.8 VEL STRL LF (GAUZE/BANDAGES/DRESSINGS) ×2 IMPLANT
BLADE SURG 15 STRL LF DISP TIS (BLADE) ×1 IMPLANT
BLADE SURG 15 STRL SS (BLADE) ×2
BNDG CMPR 9X4 STRL LF SNTH (GAUZE/BANDAGES/DRESSINGS)
BNDG ESMARK 4X9 LF (GAUZE/BANDAGES/DRESSINGS) IMPLANT
BRUSH SCRUB EZ PLAIN DRY (MISCELLANEOUS) ×2 IMPLANT
CANISTER SUCT 1200ML W/VALVE (MISCELLANEOUS) ×2 IMPLANT
CORD BIPOLAR FORCEPS 12FT (ELECTRODE) ×2 IMPLANT
COVER BACK TABLE REUSABLE LG (DRAPES) ×2 IMPLANT
COVER MAYO STAND REUSABLE (DRAPES) ×2 IMPLANT
COVER WAND RF STERILE (DRAPES) IMPLANT
CUFF TOURN SGL QUICK 18X4 (TOURNIQUET CUFF) ×2 IMPLANT
DECANTER SPIKE VIAL GLASS SM (MISCELLANEOUS) IMPLANT
DRAPE EXTREMITY T 121X128X90 (DISPOSABLE) ×2 IMPLANT
DRAPE SURG 17X23 STRL (DRAPES) ×2 IMPLANT
GAUZE SPONGE 4X4 12PLY STRL (GAUZE/BANDAGES/DRESSINGS) ×2 IMPLANT
GAUZE XEROFORM 1X8 LF (GAUZE/BANDAGES/DRESSINGS) ×2 IMPLANT
GLOVE BIO SURGEON STRL SZ 6.5 (GLOVE) ×1 IMPLANT
GLOVE BIOGEL PI IND STRL 7.0 (GLOVE) ×1 IMPLANT
GLOVE BIOGEL PI INDICATOR 7.0 (GLOVE) ×3
GLOVE ECLIPSE 7.0 STRL STRAW (GLOVE) ×2 IMPLANT
GLOVE SKINSENSE NS SZ7.5 (GLOVE) ×1
GLOVE SKINSENSE STRL SZ7.5 (GLOVE) ×1 IMPLANT
GLOVE SURG SYN 7.5  E (GLOVE) ×1
GLOVE SURG SYN 7.5 E (GLOVE) ×1 IMPLANT
GLOVE SURG SYN 7.5 PF PI (GLOVE) ×1 IMPLANT
GOWN STRL REIN XL XLG (GOWN DISPOSABLE) ×2 IMPLANT
GOWN STRL REUS W/ TWL XL LVL3 (GOWN DISPOSABLE) ×1 IMPLANT
GOWN STRL REUS W/TWL XL LVL3 (GOWN DISPOSABLE) ×2
NDL HYPO 25X1 1.5 SAFETY (NEEDLE) ×1 IMPLANT
NEEDLE HYPO 25X1 1.5 SAFETY (NEEDLE) ×2 IMPLANT
NS IRRIG 1000ML POUR BTL (IV SOLUTION) ×2 IMPLANT
PACK BASIN DAY SURGERY FS (CUSTOM PROCEDURE TRAY) ×2 IMPLANT
PAD CAST 3X4 CTTN HI CHSV (CAST SUPPLIES) ×1 IMPLANT
PADDING CAST COTTON 3X4 STRL (CAST SUPPLIES) ×2
RUBBERBAND STERILE (MISCELLANEOUS) ×4 IMPLANT
STOCKINETTE 4X48 STRL (DRAPES) ×2 IMPLANT
SUT ETHILON 4 0 PS 2 18 (SUTURE) ×2 IMPLANT
SYR BULB 3OZ (MISCELLANEOUS) ×2 IMPLANT
SYR CONTROL 10ML LL (SYRINGE) ×2 IMPLANT
TOWEL GREEN STERILE FF (TOWEL DISPOSABLE) ×2 IMPLANT
TRAY DSU PREP LF (CUSTOM PROCEDURE TRAY) ×2 IMPLANT
TUBE CONNECTING 20X1/4 (TUBING) ×2 IMPLANT
UNDERPAD 30X30 (UNDERPADS AND DIAPERS) ×2 IMPLANT

## 2019-06-11 NOTE — Anesthesia Postprocedure Evaluation (Signed)
Anesthesia Post Note  Patient: Paula Richard  Procedure(s) Performed: RELEASE TRIGGER FINGER LEFT MIDDLE FINGER (Left Hand)     Patient location during evaluation: PACU Anesthesia Type: Bier Block Level of consciousness: awake and alert Pain management: pain level controlled Vital Signs Assessment: post-procedure vital signs reviewed and stable Respiratory status: spontaneous breathing, nonlabored ventilation and respiratory function stable Cardiovascular status: blood pressure returned to baseline and stable Postop Assessment: no apparent nausea or vomiting Anesthetic complications: no    Last Vitals:  Vitals:   06/11/19 0915 06/11/19 0932  BP:  (!) 155/89  Pulse: 64   Resp: 13 18  Temp:  37.1 C  SpO2: 100% 96%    Last Pain:  Vitals:   06/11/19 0932  TempSrc:   PainSc: 0-No pain                 Lidia Collum

## 2019-06-11 NOTE — Anesthesia Preprocedure Evaluation (Addendum)
Anesthesia Evaluation  Patient identified by MRN, date of birth, ID band Patient awake    Reviewed: Allergy & Precautions, NPO status , Patient's Chart, lab work & pertinent test results, reviewed documented beta blocker date and time   History of Anesthesia Complications Negative for: history of anesthetic complications  Airway Mallampati: II  TM Distance: >3 FB Neck ROM: Full    Dental  (+) Teeth Intact   Pulmonary neg pulmonary ROS,    Pulmonary exam normal        Cardiovascular hypertension, Pt. on medications and Pt. on home beta blockers Normal cardiovascular exam     Neuro/Psych PSYCHIATRIC DISORDERS Anxiety negative neurological ROS     GI/Hepatic Neg liver ROS, GERD  ,  Endo/Other  diabetes, Type 2, Insulin Dependent, Oral Hypoglycemic Agents  Renal/GU Renal disease (FSGS)  negative genitourinary   Musculoskeletal negative musculoskeletal ROS (+)   Abdominal   Peds  Hematology negative hematology ROS (+)   Anesthesia Other Findings Stress test 2016: no ischemia, EF 70%  Reproductive/Obstetrics                           Anesthesia Physical Anesthesia Plan  ASA: III  Anesthesia Plan: Bier Block and Bier Block-LIDOCAINE ONLY   Post-op Pain Management:    Induction:   PONV Risk Score and Plan: 2 and Propofol infusion and Treatment may vary due to age or medical condition  Airway Management Planned: Nasal Cannula and Simple Face Mask  Additional Equipment: None  Intra-op Plan:   Post-operative Plan:   Informed Consent: I have reviewed the patients History and Physical, chart, labs and discussed the procedure including the risks, benefits and alternatives for the proposed anesthesia with the patient or authorized representative who has indicated his/her understanding and acceptance.       Plan Discussed with:   Anesthesia Plan Comments:        Anesthesia Quick  Evaluation

## 2019-06-11 NOTE — Anesthesia Procedure Notes (Signed)
Anesthesia Regional Block: Bier block (IV Regional)   Pre-Anesthetic Checklist: ,, timeout performed, Correct Patient, Correct Site, Correct Laterality, Correct Procedure, Correct Position, site marked, Risks and benefits discussed, Surgical consent,  Pre-op evaluation,  At surgeon's request  Laterality: Left  Prep: alcohol swabs        Procedures:,,,,,,,, #20gu IV placed  Narrative:  CRNA: Verita Lamb, CRNA

## 2019-06-11 NOTE — Op Note (Signed)
   Date of Surgery: 06/11/2019  INDICATIONS: Paula Richard is a 68 y.o.-year-old female who presents for surgical treatment of a left middle trigger finger;  The patient did consent to the procedure after discussion of the risks and benefits.  PREOPERATIVE DIAGNOSIS: left middle trigger finger  POSTOPERATIVE DIAGNOSIS: Same.  PROCEDURE: Incision of A1 pulley for trigger finger release, left middle finger  SURGEON: N. Eduard Roux, M.D.  ASSIST: Ciro Backer Boyd, [place]; necessary for the timely completion of procedure and due to complexity of procedure..  ANESTHESIA:  Bier block  IV FLUIDS AND URINE: See anesthesia.  ESTIMATED BLOOD LOSS: minimal mL  COMPLICATIONS: None.  DESCRIPTION OF PROCEDURE: The patient was identified in the preoperative holding area. The operative site was marked by the surgeon confirmed with the patient. He is brought back to the operating room. She was placed supine on table. A nonsterile tourniquet was placed on the upper forearm. The extremity was exsanguinated using Esmarch bandage and the tourniquet was inflated to 250 mmHg. The Bier block was administered. The operative extremity was prepped and draped in standard sterile fashion. Timeout was performed. Antibiotics were given. Timeout was performed. A horizontal incision based in the palmar crease in line with the left middle finger was used. Blunt dissection was taken down to the level of the flexor tendon. The neurovascular bundles were identified on each side of the tendon sheath and protected. The possible edge of the A1 pulley was identified. This was sharply incised. Of note the tendon was of good quality and did not exhibit any tears. The A1 pulley was incised along its full width. Care was taken not to violate the A2 pulley. The palmar pulley was then visualized and released also. The tourniquet was then deflated and hemostasis was obtained. Local anesthesia was infiltrated. The wound was thoroughly  irrigated and closed with 3-0 nylon sutures. Sterile dressings were applied and the hand was placed in a soft dressing. Patient tolerated the procedure well and was taken to the PACU in stable condition.  POSTOPERATIVE PLAN: Patient will be weight bearing as tolerated and to avoid heavy lifting for 4 weeks.    Paula Cecil, MD [place] 8:49 AM

## 2019-06-11 NOTE — H&P (Signed)
PREOPERATIVE H&P  Chief Complaint: left middle finger trigger finger  HPI: Paula Richard is a 68 y.o. female who presents for surgical treatment of left middle finger trigger finger.  She denies any changes in medical history.  Past Medical History:  Diagnosis Date  . Anxiety   . Arthritis    back, knees  . Cyst of buttocks 02/2018   right  . Dental crowns present   . FSGS (focal segmental glomerulosclerosis)    states currently in remission  . GERD (gastroesophageal reflux disease)   . History of Bell's palsy   . History of shingles   . Hypertension    states under control with med., has been on med. x 30 yr.  . Insulin dependent diabetes mellitus ([place])   . Neuropathy    feet  . Seasonal allergies    Past Surgical History:  Procedure Laterality Date  . ABDOMINAL HYSTERECTOMY     complete  . CARPAL TUNNEL RELEASE Bilateral   . FOOT SURGERY Bilateral   . MASS EXCISION Right 03/29/2018   Procedure: EXCISION OF RIGHT BUTTOCK CYST;  Surgeon: Johnathan Hausen, MD;  Location: [facility];  Service: General;  Laterality: Right;  . NASAL SEPTUM SURGERY    . STERIOD INJECTION Left 05/23/2017   Procedure: LEFT TRIGGER FINGER INJECTION;  Surgeon: Leandrew Koyanagi, MD;  Location: [facility];  Service: Orthopedics;  Laterality: Left;  . TONSILLECTOMY    . TRIGGER FINGER RELEASE Right 05/23/2017   Procedure: RIGHT RING FINGER RELEASE TRIGGER FINGER AND TENOLYSIS, LEFT RING FINGER TRIGGER FINGER INJECTION;  Surgeon: Leandrew Koyanagi, MD;  Location: [facility];  Service: Orthopedics;  Laterality: Right;  . TRIGGER FINGER RELEASE Left 12/26/2017   Procedure: RELEASE TRIGGER FINGER LEFT RING FINGER;  Surgeon: Leandrew Koyanagi, MD;  Location: [facility];  Service: Orthopedics;  Laterality: Left;   Social History   Socioeconomic History  . Marital status: Married    Spouse name: Not on file  . Number of children: Not on file  .  Years of education: Not on file  . Highest education level: Not on file  Occupational History  . Not on file  Social Needs  . Financial resource strain: Not on file  . Food insecurity    Worry: Not on file    Inability: Not on file  . Transportation needs    Medical: Not on file    Non-medical: Not on file  Tobacco Use  . Smoking status: Never Smoker  . Smokeless tobacco: Never Used  Substance and Sexual Activity  . Alcohol use: No  . Drug use: No  . Sexual activity: Not on file  Lifestyle  . Physical activity    Days per week: Not on file    Minutes per session: Not on file  . Stress: Not on file  Relationships  . Social Herbalist on phone: Not on file    Gets together: Not on file    Attends religious service: Not on file    Active member of club or organization: Not on file    Attends meetings of clubs or organizations: Not on file    Relationship status: Not on file  Other Topics Concern  . Not on file  Social History Narrative  . Not on file   History reviewed. No pertinent family history. No Known Allergies Prior to Admission medications   Medication Sig Start Date End Date  Taking? Authorizing Provider  aspirin 81 MG tablet Take 81 mg by mouth daily.   Yes [provider]  atorvastatin (LIPITOR) 40 MG tablet Take 40 mg by mouth daily.   Yes [provider]  cholecalciferol (VITAMIN D) 1000 units tablet Take 1,000 Units by mouth daily.   Yes [provider]  cyclobenzaprine (FLEXERIL) 5 MG tablet Take 1-2 tablets (5-10 mg total) by mouth 3 (three) times daily as needed for muscle spasms. 09/12/18  Yes Leandrew Koyanagi, MD  diclofenac sodium (VOLTAREN) 1 % GEL Apply 2 g topically 4 (four) times daily. 03/05/19  Yes Leandrew Koyanagi, MD  fexofenadine (ALLEGRA) 180 MG tablet Take 180 mg by mouth daily.   Yes [provider]  gabapentin (NEURONTIN) 600 MG tablet Take 600 mg by mouth 2 (two) times daily.   Yes [provider]  gabapentin (NEURONTIN) 600 MG tablet Take 1,200 mg by mouth at bedtime.   Yes [provider]  Insulin Glargine (TOUJEO SOLOSTAR [place]) Inject 50 Units into the skin at bedtime.   Yes [provider]  Insulin Glulisine (APIDRA [place]) Inject 22 Units into the skin 3 (three) times daily before meals.    Yes [provider]  losartan (COZAAR) 50 MG tablet Take 50 mg by mouth daily.   Yes [provider]  metFORMIN (GLUCOPHAGE) 1000 MG tablet Take 1,000 mg by mouth daily after supper.   Yes [provider]  metFORMIN (GLUCOPHAGE) 500 MG tablet Take 500 mg by mouth daily with breakfast.   Yes [provider]  pantoprazole (PROTONIX) 20 MG tablet Take 20 mg by mouth daily.   Yes [provider]  propranolol (INDERAL) 80 MG tablet Take 80 mg by mouth 2 (two) times daily.    Yes [provider]  traMADol-acetaminophen (ULTRACET) 37.5-325 MG tablet TK 1 T PO  BID PRN P 07/30/16  Yes [provider]  venlafaxine (EFFEXOR) 75 MG tablet Take 75 mg by mouth 2 (two) times daily.   Yes [provider]  diazepam (VALIUM) 5 MG tablet Take 1 by mouth 1 hour  pre-procedure with very light food. May bring 2nd tablet to appointment. 09/27/18   Magnus Sinning, MD     Positive ROS: All other systems have been reviewed and were otherwise negative with the exception of those mentioned in the HPI and as above.  Physical Exam: General: Alert, no acute distress Cardiovascular: No pedal edema Respiratory: No cyanosis, no use of accessory musculature GI: abdomen soft Skin: No lesions in the area of chief complaint Neurologic: Sensation intact distally Psychiatric: Patient is competent for consent with normal mood and affect Lymphatic: no lymphedema  MUSCULOSKELETAL: exam stable  Assessment: left middle finger trigger finger  Plan: Plan for Procedure(s): RELEASE TRIGGER FINGER LEFT MIDDLE FINGER  The risks benefits and  alternatives were discussed with the patient including but not limited to the risks of nonoperative treatment, versus surgical intervention including infection, bleeding, nerve injury,  blood clots, cardiopulmonary complications, morbidity, mortality, among others, and they were willing to proceed.   Eduard Roux, MD   06/11/2019 7:20 AM

## 2019-06-11 NOTE — Transfer of Care (Signed)
Immediate Anesthesia Transfer of Care Note  Patient: Paula Richard  Procedure(s) Performed: RELEASE TRIGGER FINGER LEFT MIDDLE FINGER (Left Hand)  Patient Location: PACU  Anesthesia Type:MAC and Bier block  Level of Consciousness: awake, alert  and oriented  Airway & Oxygen Therapy: Patient Spontanous Breathing and Patient connected to face mask oxygen  Post-op Assessment: Report given to RN and Post -op Vital signs reviewed and stable  Post vital signs: Reviewed and stable  Last Vitals:  Vitals Value Taken Time  BP    Temp    Pulse 65 06/11/19 0858  Resp    SpO2 100 % 06/11/19 0858  Vitals shown include unvalidated device data.  Last Pain:  Vitals:   06/11/19 4562  TempSrc: Oral  PainSc: 0-No pain         Complications: No apparent anesthesia complications

## 2019-06-11 NOTE — Discharge Instructions (Signed)
° °Postoperative instructions: ° °Weightbearing instructions: no lifting more than 10 lbs ° °Dressing instructions: Keep your dressing and/or splint clean and dry at all times.  It will be removed at your first post-operative appointment.  Your stitches and/or staples will be removed at this visit. ° °Incision instructions:  Do not soak your incision for 3 weeks after surgery.  If the incision gets wet, pat dry and do not scrub the incision. ° °Pain control:  You have been given a prescription to be taken as directed for post-operative pain control.  In addition, elevate the operative extremity above the heart at all times to prevent swelling and throbbing pain. ° °Take over-the-counter Colace, 100mg by mouth twice a day while taking narcotic pain medications to help prevent constipation. ° °Follow up appointments: °1) 12-14 days for suture removal and wound check. °2) Dr. Xu as scheduled. ° ° ------------------------------------------------------------------------------------------------------------- ° °After Surgery Pain Control: ° °After your surgery, post-surgical discomfort or pain is likely. This discomfort can last several days to a few weeks. At certain times of the day your discomfort may be more intense.  °Did you receive a nerve block?  °A nerve block can provide pain relief for one hour to two days after your surgery. As long as the nerve block is working, you will experience little or no sensation in the area the surgeon operated on.  °As the nerve block wears off, you will begin to experience pain or discomfort. It is very important that you begin taking your prescribed pain medication before the nerve block fully wears off. Treating your pain at the first sign of the block wearing off will ensure your pain is better controlled and more tolerable when full-sensation returns. Do not wait until the pain is intolerable, as the medicine will be less effective. It is better to treat pain in advance than to  try and catch up.  °General Anesthesia:  °If you did not receive a nerve block during your surgery, you will need to start taking your pain medication shortly after your surgery and should continue to do so as prescribed by your surgeon.  °Pain Medication:  °Most commonly we prescribe Vicodin and Percocet for post-operative pain. Both of these medications contain a combination of acetaminophen (Tylenol®) and a narcotic to help control pain.  °· It takes between 30 and 45 minutes before pain medication starts to work. It is important to take your medication before your pain level gets too intense.  °· Nausea is a common side effect of many pain medications. You will want to eat something before taking your pain medicine to help prevent nausea.  °· If you are taking a prescription pain medication that contains acetaminophen, we recommend that you do not take additional over the counter acetaminophen (Tylenol®).  °Other pain relieving options:  °· Using a cold pack to ice the affected area a few times a day (15 to 20 minutes at a time) can help to relieve pain, reduce swelling and bruising.  °· Elevation of the affected area can also help to reduce pain and swelling. ° ° ° °Post Anesthesia Home Care Instructions ° °Activity: °Get plenty of rest for the remainder of the day. A responsible individual must stay with you for 24 hours following the procedure.  °For the next 24 hours, DO NOT: °-Drive a car °-Operate machinery °-Drink alcoholic beverages °-Take any medication unless instructed by your physician °-Make any legal decisions or sign important papers. ° °Meals: °Start with liquid foods   such as gelatin or soup. Progress to regular foods as tolerated. Avoid greasy, spicy, heavy foods. If nausea and/or vomiting occur, drink only clear liquids until the nausea and/or vomiting subsides. Call your physician if vomiting continues. ° °Special Instructions/Symptoms: °Your throat may feel dry or sore from the anesthesia or  the breathing tube placed in your throat during surgery. If this causes discomfort, gargle with warm salt water. The discomfort should disappear within 24 hours. ° °If you had a scopolamine patch placed behind your ear for the management of post- operative nausea and/or vomiting: ° °1. The medication in the patch is effective for 72 hours, after which it should be removed.  Wrap patch in a tissue and discard in the trash. Wash hands thoroughly with soap and water. °2. You may remove the patch earlier than 72 hours if you experience unpleasant side effects which may include dry mouth, dizziness or visual disturbances. °3. Avoid touching the patch. Wash your hands with soap and water after contact with the patch. °   ° ° °

## 2019-06-12 ENCOUNTER — Encounter (HOSPITAL_BASED_OUTPATIENT_CLINIC_OR_DEPARTMENT_OTHER): Payer: Self-pay | Admitting: Orthopaedic Surgery

## 2019-06-16 ENCOUNTER — Telehealth: Payer: Self-pay

## 2019-06-16 NOTE — Progress Notes (Signed)
Paula Richard - 68 y.o. female MRN [id number]  Date of birth: 05/26/1951  Office Visit Note: Visit Date: 09/27/2018 PCP: Velna Hatchet, MD Referred by: Velna Hatchet, MD  Subjective: Chief Complaint  Patient presents with  . Neck - Pain   HPI: Paula Richard is a 68 y.o. female who comes in today For evaluation and a right-sided neck pain with paresthesia and pain in the right hand and arm.  Patient comes in today at the request of Dr. Eduard Roux.  She has failed conservative care otherwise with medication management and activity modification and therapy.  She reports right-sided neck pain that radiates into the right arm she does have some right hand numbness more of a C6 distribution classically.  Activity seems to make it worse.  She has been taking gabapentin with maybe mild relief.  She also uses muscle relaxers at night when the pain is severe.  She rates her pain as a 6 out of 10 dull and aching pain that really problematic daily in terms of trying to do activities of daily living.  She denies any left-sided complaints.  She is right-hand dominant.  She has had no recent electrodiagnostic studies.  She has had a history of repeated trigger finger and she has had bilateral carpal tunnel releases remotely.  Her symptoms are complicated by type 2 insulin-dependent diabetes with history of neuropathy.  Review of Systems  Constitutional: Negative for chills, fever, malaise/fatigue and weight loss.  HENT: Negative for hearing loss and sinus pain.   Eyes: Negative for blurred vision, double vision and photophobia.  Respiratory: Negative for cough and shortness of breath.   Cardiovascular: Negative for chest pain, palpitations and leg swelling.  Gastrointestinal: Negative for abdominal pain, nausea and vomiting.  Genitourinary: Negative for flank pain.  Musculoskeletal: Positive for neck pain. Negative for myalgias.  Skin: Negative for itching and rash.  Neurological: Positive  for tingling. Negative for tremors, focal weakness and weakness.  Endo/Heme/Allergies: Negative.   Psychiatric/Behavioral: Negative for depression.  All other systems reviewed and are negative.  Otherwise per HPI.  Assessment & Plan: Visit Diagnoses:  1. Cervical radiculopathy   2. Cervical disc disorder with radiculopathy   3. Spinal stenosis of cervical region   4. Pre-operative anxiety     Plan: Findings:  Chronic worsening severe at times right-sided neck pain with radicular pain in the right arm with paresthesia.  This seems to be consistent with cervical MRI which was performed by Dr. Eduard Roux.  She has multilevel findings and C4-5 C5-6 and C6-7.  She has by foraminal narrowing and uncovertebral joint hypertrophy as well as disc bulge with paracentral disc at C6-7.  She has some foraminal stenosis bilaterally but no high-grade specific right-sided nerve root compression.  She has some impression upon the cord but no cord signal changes.  She does not have any severe central stenosis.  I do think epidural injection diagnostically hopefully therapeutically would be worthwhile.  Depending on relief would suggest repeat injection or surgical evaluation.  Could consider repeat electrodiagnostic study as well.  She will continue with current exercise program and medications and activity modification.  We are going to give her preprocedure Valium DVT operative anxiety.    Meds & Orders:  Meds ordered this encounter  Medications  . diazepam (VALIUM) 5 MG tablet    Sig: Take 1 by mouth 1 hour  pre-procedure with very light food. May bring 2nd tablet to appointment.    Dispense:  2  tablet    Refill:  0   No orders of the defined types were placed in this encounter.   Follow-up: No follow-ups on file.   Procedures: No procedures performed  No notes on file   Clinical History: MRI CERVICAL SPINE WITHOUT CONTRAST  TECHNIQUE: Multiplanar, multisequence MR imaging of the cervical spine  was performed. No intravenous contrast was administered.  COMPARISON:  None.  FINDINGS: Alignment: Physiologic.  Vertebrae: No fracture, evidence of discitis, or bone lesion.  Cord: Normal signal and morphology.  Posterior Fossa, vertebral arteries, paraspinal tissues: None Posterior fossa demonstrates no focal abnormality. Vertebral artery flow voids are maintained. Paraspinal soft tissues are unremarkable.  Disc levels:  Discs: Degenerative disc disease with disc height loss at C5-6 and C6-7.  C2-3: No significant disc bulge. No neural foraminal stenosis. No central canal stenosis. Mild left facet arthropathy.  C3-4: No significant disc bulge. No neural foraminal stenosis. No central canal stenosis. Moderate bilateral facet arthropathy.  C4-5: Small central disc protrusion contacting the ventral cervical spinal cord. Moderate left facet arthropathy. No neural foraminal stenosis. No central canal stenosis.  C5-6: Broad-based disc bulge. Bilateral uncovertebral degenerative changes. Severe bilateral foraminal stenosis. No central canal stenosis.  C6-7: Broad-based disc bulge with a broad central disc protrusion contacting the ventral cervical spinal cord. Bilateral uncovertebral degenerative changes. Moderate-severe left foraminal stenosis. Mild right foraminal stenosis. Mild central canal stenosis.  C7-T1: No significant disc bulge. No neural foraminal stenosis. No central canal stenosis. Mild bilateral facet arthropathy.  T1-2: Mild broad-based disc bulge. Mild bilateral facet arthropathy. No foraminal stenosis.  T2-3: Mild broad-based disc bulge. No foraminal or central canal stenosis. Mild bilateral facet arthropathy.  IMPRESSION: 1. Diffuse cervical spine spondylosis as described above. 2. At C4-5 there is a small central disc protrusion contacting the ventral cervical spinal cord. Moderate left facet arthropathy. No neural foraminal stenosis.  No central canal stenosis. 3. At C5-6 there is a broad-based disc bulge. Bilateral uncovertebral degenerative changes. Severe bilateral foraminal stenosis. 4. At C6-7 there is a broad-based disc bulge with a broad central disc protrusion contacting the ventral cervical spinal cord. Bilateral uncovertebral degenerative changes. Moderate-severe left foraminal stenosis. Mild right foraminal stenosis. Mild central canal stenosis.   Electronically Signed   By: Kathreen Devoid   On: 09/09/2018 08:13   She reports that she has never smoked. She has never used smokeless tobacco. No results for input(s): HGBA1C, LABURIC in the last 8760 hours.  Objective:  VS:  HT:    WT:   BMI:     BP:(!) 123/59  HR:67bpm  TEMP: ( )  RESP:  Physical Exam Vitals signs and nursing note reviewed.  Constitutional:      General: She is not in acute distress.    Appearance: Normal appearance. She is well-developed. She is not ill-appearing.  HENT:     Head: Normocephalic and atraumatic.  Eyes:     Conjunctiva/sclera: Conjunctivae normal.     Pupils: Pupils are equal, round, and reactive to light.  Neck:     Musculoskeletal: Neck supple. Muscular tenderness present. No neck rigidity.  Cardiovascular:     Rate and Rhythm: Normal rate.     Pulses: Normal pulses.  Pulmonary:     Effort: Pulmonary effort is normal.  Musculoskeletal:     Right lower leg: No edema.     Left lower leg: No edema.     Comments: Patient sits with forward flexed cervical spine she does have pain at end ranges of  rotation bilaterally with extension.  She has negative Spurling's test bilaterally.  She does have some myofascial trigger points in the levator scapula trapezius and rhomboid.  She has minimal shoulder impingement but does have some right-sided impingement.  She has good strength in the upper limbs with intact sensation bilaterally.  Negative Hoffmann's test.  Lymphadenopathy:     Cervical: No cervical adenopathy.  Skin:     General: Skin is warm and dry.     Findings: No erythema or rash.  Neurological:     General: No focal deficit present.     Mental Status: She is alert and oriented to person, place, and time.     Sensory: No sensory deficit.     Motor: No abnormal muscle tone.     Coordination: Coordination normal.     Gait: Gait normal.  Psychiatric:        Mood and Affect: Mood normal.        Behavior: Behavior normal.     Ortho Exam Imaging: No results found.  Past Medical/Family/Surgical/Social History: Medications & Allergies reviewed per EMR, new medications updated. Patient Active Problem List   Diagnosis Date Noted  . Extensor tenosynovitis of left wrist 03/05/2019  . Trigger finger, left middle finger 03/05/2019  . Hamstring tendinitis of right thigh 03/05/2019  . Unilateral primary osteoarthritis, left knee 05/08/2017  . Trigger finger, left ring finger 05/08/2017  . Trigger finger, right ring finger 10/23/2016  . Other chest pain 04/14/2015  . Type 2 diabetes mellitus without complication ([place]) 41/32/4401  . Hyperlipidemia 04/14/2015  . Essential hypertension 04/14/2015  . Epigastric pain 04/14/2015  . Palpitations 04/14/2015   Past Medical History:  Diagnosis Date  . Anxiety   . Arthritis    back, knees  . Cyst of buttocks 02/2018   right  . Dental crowns present   . FSGS (focal segmental glomerulosclerosis)    states currently in remission  . GERD (gastroesophageal reflux disease)   . History of Bell's palsy   . History of shingles   . Hypertension    states under control with med., has been on med. x 30 yr.  . Insulin dependent diabetes mellitus ([place])   . Neuropathy    feet  . Seasonal allergies    History reviewed. No pertinent family history. Past Surgical History:  Procedure Laterality Date  . ABDOMINAL HYSTERECTOMY     complete  . CARPAL TUNNEL RELEASE Bilateral   . FOOT SURGERY Bilateral   . MASS EXCISION Right 03/29/2018   Procedure: EXCISION OF  RIGHT BUTTOCK CYST;  Surgeon: Johnathan Hausen, MD;  Location: [facility];  Service: General;  Laterality: Right;  . NASAL SEPTUM SURGERY    . STERIOD INJECTION Left 05/23/2017   Procedure: LEFT TRIGGER FINGER INJECTION;  Surgeon: Leandrew Koyanagi, MD;  Location: [facility];  Service: Orthopedics;  Laterality: Left;  . TONSILLECTOMY    . TRIGGER FINGER RELEASE Right 05/23/2017   Procedure: RIGHT RING FINGER RELEASE TRIGGER FINGER AND TENOLYSIS, LEFT RING FINGER TRIGGER FINGER INJECTION;  Surgeon: Leandrew Koyanagi, MD;  Location: [facility];  Service: Orthopedics;  Laterality: Right;  . TRIGGER FINGER RELEASE Left 12/26/2017   Procedure: RELEASE TRIGGER FINGER LEFT RING FINGER;  Surgeon: Leandrew Koyanagi, MD;  Location: [facility];  Service: Orthopedics;  Laterality: Left;  . TRIGGER FINGER RELEASE Left 06/11/2019   Procedure: RELEASE TRIGGER FINGER LEFT MIDDLE FINGER;  Surgeon: Leandrew Koyanagi, MD;  Location:  [facility];  Service: Orthopedics;  Laterality: Left;   Social History   Occupational History  . Not on file  Tobacco Use  . Smoking status: Never Smoker  . Smokeless tobacco: Never Used  Substance and Sexual Activity  . Alcohol use: No  . Drug use: No  . Sexual activity: Not on file

## 2019-06-16 NOTE — Telephone Encounter (Signed)
Patient would like to know if she needs to come into the office to have left hand redressed or if she could remove th dressing?  Stated that the dressing is loose.  Patient had left middle finger surgery on Wednesday, 06/11/2019. CB# is [phone]. Please advise.  Thank you.

## 2019-06-17 NOTE — Telephone Encounter (Signed)
Talked with patient and advised her of message below per Mendel Ryder.  Patient stated that she understands.

## 2019-06-17 NOTE — Telephone Encounter (Signed)
If just the ace wrap, can take it off and wrap it up tighter.

## 2019-06-25 ENCOUNTER — Encounter: Payer: Self-pay | Admitting: Physician Assistant

## 2019-06-25 ENCOUNTER — Other Ambulatory Visit: Payer: Self-pay

## 2019-06-25 ENCOUNTER — Ambulatory Visit (INDEPENDENT_AMBULATORY_CARE_PROVIDER_SITE_OTHER): Payer: Medicare Other | Admitting: Physician Assistant

## 2019-06-25 DIAGNOSIS — M65332 Trigger finger, left middle finger: Secondary | ICD-10-CM

## 2019-06-25 NOTE — Progress Notes (Signed)
Post-Op Visit Note   Patient: Paula Richard           Date of Birth: [birth year]           MRN: [id number] Visit Date: 06/25/2019 PCP: Velna Hatchet, MD   Assessment & Plan:  Chief Complaint:  Chief Complaint  Patient presents with  . Left Middle Finger - Routine Post Op   Visit Diagnoses:  1. Trigger finger, left middle finger     Plan: Patient is a pleasant 68 year old female who presents our clinic today 14 days status post left long finger trigger finger release, date of surgery 06/11/2019.  She has been doing well.  Minimal pain.  No fevers or chills.  Examination of her hand reveals a well healing surgical incision with nylon sutures in place.  She does have slight peri-incisional erythema.  No drainage.  Today, nylon sutures were removed.  Steri-Strips applied and covered with a Band-Aid.  She will avoid any submerging the hand in water or heavy lifting for another 2 weeks.  Follow-up with [place] in 4 weeks time for recheck.  Call with concerns or questions in the meantime.  Follow-Up Instructions: Return in about 4 weeks (around 07/23/2019).   Orders:  No orders of the defined types were placed in this encounter.  No orders of the defined types were placed in this encounter.   Imaging: No new imaging  PMFS History: Patient Active Problem List   Diagnosis Date Noted  . Extensor tenosynovitis of left wrist 03/05/2019  . Trigger finger, left middle finger 03/05/2019  . Hamstring tendinitis of right thigh 03/05/2019  . Unilateral primary osteoarthritis, left knee 05/08/2017  . Trigger finger, left ring finger 05/08/2017  . Trigger finger, right ring finger 10/23/2016  . Other chest pain 04/14/2015  . Type 2 diabetes mellitus without complication ([place]) 05/20/7627  . Hyperlipidemia 04/14/2015  . Essential hypertension 04/14/2015  . Epigastric pain 04/14/2015  . Palpitations 04/14/2015   Past Medical History:  Diagnosis Date  . Anxiety   . Arthritis    back,  knees  . Cyst of buttocks 02/2018   right  . Dental crowns present   . FSGS (focal segmental glomerulosclerosis)    states currently in remission  . GERD (gastroesophageal reflux disease)   . History of Bell's palsy   . History of shingles   . Hypertension    states under control with med., has been on med. x 30 yr.  . Insulin dependent diabetes mellitus ([place])   . Neuropathy    feet  . Seasonal allergies     History reviewed. No pertinent family history.  Past Surgical History:  Procedure Laterality Date  . ABDOMINAL HYSTERECTOMY     complete  . CARPAL TUNNEL RELEASE Bilateral   . FOOT SURGERY Bilateral   . MASS EXCISION Right 03/29/2018   Procedure: EXCISION OF RIGHT BUTTOCK CYST;  Surgeon: Johnathan Hausen, MD;  Location: [facility];  Service: General;  Laterality: Right;  . NASAL SEPTUM SURGERY    . STERIOD INJECTION Left 05/23/2017   Procedure: LEFT TRIGGER FINGER INJECTION;  Surgeon: Leandrew Koyanagi, MD;  Location: [facility];  Service: Orthopedics;  Laterality: Left;  . TONSILLECTOMY    . TRIGGER FINGER RELEASE Right 05/23/2017   Procedure: RIGHT RING FINGER RELEASE TRIGGER FINGER AND TENOLYSIS, LEFT RING FINGER TRIGGER FINGER INJECTION;  Surgeon: Leandrew Koyanagi, MD;  Location: [facility];  Service: Orthopedics;  Laterality: Right;  . TRIGGER  FINGER RELEASE Left 12/26/2017   Procedure: RELEASE TRIGGER FINGER LEFT RING FINGER;  Surgeon: Leandrew Koyanagi, MD;  Location: [facility];  Service: Orthopedics;  Laterality: Left;  . TRIGGER FINGER RELEASE Left 06/11/2019   Procedure: RELEASE TRIGGER FINGER LEFT MIDDLE FINGER;  Surgeon: Leandrew Koyanagi, MD;  Location: [facility];  Service: Orthopedics;  Laterality: Left;   Social History   Occupational History  . Not on file  Tobacco Use  . Smoking status: Never Smoker  . Smokeless tobacco: Never Used  Substance and Sexual Activity  . Alcohol use: No  . Drug use: No   . Sexual activity: Not on file

## 2019-07-17 DIAGNOSIS — Z13820 Encounter for screening for osteoporosis: Secondary | ICD-10-CM | POA: Diagnosis not present

## 2019-07-23 ENCOUNTER — Ambulatory Visit: Payer: Medicare Other | Admitting: Orthopaedic Surgery

## 2019-07-24 ENCOUNTER — Encounter: Payer: Self-pay | Admitting: Orthopaedic Surgery

## 2019-07-24 ENCOUNTER — Ambulatory Visit (INDEPENDENT_AMBULATORY_CARE_PROVIDER_SITE_OTHER): Payer: Medicare Other | Admitting: Orthopaedic Surgery

## 2019-07-24 VITALS — Ht 63.0 in | Wt 150.0 lb

## 2019-07-24 DIAGNOSIS — M65332 Trigger finger, left middle finger: Secondary | ICD-10-CM

## 2019-07-24 NOTE — Progress Notes (Signed)
Patient ID: Paula Richard, female   DOB: August 20, 1951, 68 y.o.   MRN: [id number]  [name] follows up 6 weeks status post left middle trigger finger release.  She is doing well has no complaints.  Surgical scar is fully healed.  She is able to make a full composite fist.  She has no tenderness palpation.  At this point she has recovered well.  She is released to activity as tolerated.  Follow-up as needed.

## 2019-07-30 DIAGNOSIS — Z1231 Encounter for screening mammogram for malignant neoplasm of breast: Secondary | ICD-10-CM | POA: Diagnosis not present

## 2019-08-07 DIAGNOSIS — M859 Disorder of bone density and structure, unspecified: Secondary | ICD-10-CM | POA: Diagnosis not present

## 2019-08-07 DIAGNOSIS — E7849 Other hyperlipidemia: Secondary | ICD-10-CM | POA: Diagnosis not present

## 2019-08-07 DIAGNOSIS — E1165 Type 2 diabetes mellitus with hyperglycemia: Secondary | ICD-10-CM | POA: Diagnosis not present

## 2019-08-11 DIAGNOSIS — Z1331 Encounter for screening for depression: Secondary | ICD-10-CM | POA: Diagnosis not present

## 2019-08-12 ENCOUNTER — Other Ambulatory Visit: Payer: Self-pay | Admitting: Internal Medicine

## 2019-08-12 DIAGNOSIS — J302 Other seasonal allergic rhinitis: Secondary | ICD-10-CM

## 2019-08-12 DIAGNOSIS — G4489 Other headache syndrome: Secondary | ICD-10-CM

## 2019-08-21 ENCOUNTER — Ambulatory Visit
Admission: RE | Admit: 2019-08-21 | Discharge: 2019-08-21 | Disposition: A | Discharge status: Home / Self Care | Payer: Medicare Other | Source: Ambulatory Visit | Attending: Internal Medicine | Admitting: Internal Medicine

## 2019-08-21 DIAGNOSIS — J323 Chronic sphenoidal sinusitis: Secondary | ICD-10-CM | POA: Diagnosis not present

## 2019-08-21 DIAGNOSIS — J302 Other seasonal allergic rhinitis: Secondary | ICD-10-CM

## 2019-08-21 DIAGNOSIS — G4489 Other headache syndrome: Secondary | ICD-10-CM

## 2019-08-29 DIAGNOSIS — E119 Type 2 diabetes mellitus without complications: Secondary | ICD-10-CM | POA: Diagnosis not present

## 2019-09-16 DIAGNOSIS — H2513 Age-related nuclear cataract, bilateral: Secondary | ICD-10-CM | POA: Diagnosis not present

## 2019-09-16 DIAGNOSIS — H25043 Posterior subcapsular polar age-related cataract, bilateral: Secondary | ICD-10-CM | POA: Diagnosis not present

## 2019-09-16 DIAGNOSIS — H18413 Arcus senilis, bilateral: Secondary | ICD-10-CM | POA: Diagnosis not present

## 2019-09-16 DIAGNOSIS — H2511 Age-related nuclear cataract, right eye: Secondary | ICD-10-CM | POA: Diagnosis not present

## 2019-09-16 DIAGNOSIS — H25013 Cortical age-related cataract, bilateral: Secondary | ICD-10-CM | POA: Diagnosis not present

## 2019-10-02 DIAGNOSIS — Z23 Encounter for immunization: Secondary | ICD-10-CM | POA: Diagnosis not present

## 2019-10-06 DIAGNOSIS — R519 Headache, unspecified: Secondary | ICD-10-CM | POA: Diagnosis not present

## 2019-10-06 DIAGNOSIS — R509 Fever, unspecified: Secondary | ICD-10-CM | POA: Diagnosis not present

## 2019-10-06 DIAGNOSIS — J019 Acute sinusitis, unspecified: Secondary | ICD-10-CM | POA: Diagnosis not present

## 2019-10-06 DIAGNOSIS — Z20818 Contact with and (suspected) exposure to other bacterial communicable diseases: Secondary | ICD-10-CM | POA: Diagnosis not present

## 2019-10-13 DIAGNOSIS — H2511 Age-related nuclear cataract, right eye: Secondary | ICD-10-CM | POA: Diagnosis not present

## 2019-10-14 DIAGNOSIS — H25042 Posterior subcapsular polar age-related cataract, left eye: Secondary | ICD-10-CM | POA: Diagnosis not present

## 2019-10-14 DIAGNOSIS — H25012 Cortical age-related cataract, left eye: Secondary | ICD-10-CM | POA: Diagnosis not present

## 2019-10-14 DIAGNOSIS — H2512 Age-related nuclear cataract, left eye: Secondary | ICD-10-CM | POA: Diagnosis not present

## 2019-11-03 DIAGNOSIS — H2512 Age-related nuclear cataract, left eye: Secondary | ICD-10-CM | POA: Diagnosis not present

## 2019-12-23 ENCOUNTER — Ambulatory Visit: Payer: Medicare Other

## 2019-12-31 ENCOUNTER — Ambulatory Visit (INDEPENDENT_AMBULATORY_CARE_PROVIDER_SITE_OTHER): Payer: Medicare Other | Admitting: Orthopaedic Surgery

## 2019-12-31 ENCOUNTER — Other Ambulatory Visit: Payer: Self-pay

## 2019-12-31 ENCOUNTER — Encounter: Payer: Self-pay | Admitting: Physician Assistant

## 2019-12-31 ENCOUNTER — Ambulatory Visit (INDEPENDENT_AMBULATORY_CARE_PROVIDER_SITE_OTHER): Payer: Medicare Other

## 2019-12-31 DIAGNOSIS — S76312A Strain of muscle, fascia and tendon of the posterior muscle group at thigh level, left thigh, initial encounter: Secondary | ICD-10-CM | POA: Diagnosis not present

## 2019-12-31 MED ORDER — HYDROCODONE-ACETAMINOPHEN 5-325 MG PO TABS: 1.0000 | ORAL_TABLET | Freq: Every day | ORAL | 0 refills | Status: AC | PRN

## 2019-12-31 NOTE — Progress Notes (Addendum)
Office Visit Note   Patient: Paula Richard           Date of Birth: 25-Apr-1951           MRN: [id number] Visit Date: 12/31/2019              Requested by: Velna Hatchet, MD [address] [place],  [place] [address] PCP: Velna Hatchet, MD   Assessment & Plan: Visit Diagnoses:  1. Hamstring strain, left, initial encounter     Plan: Impression is left hamstring strain.  We are unable to start the patient on anti-inflammatories due to her renal history.  We will apply a compression wrap today.  She will alternate ice and heat as needed.  We will also start her in physical therapy.  She will follow-up with [place] as needed. This note is not being shared with the patient for the following reason: To respect privacy (The patient or proxy has requested that the information not be shared).  Follow-Up Instructions: Return if symptoms worsen or fail to improve.   Orders:  Orders Placed This Encounter  Procedures  . XR KNEE 3 VIEW LEFT  . Ambulatory referral to Physical Therapy   Meds ordered this encounter  Medications  . HYDROcodone-acetaminophen (NORCO) 5-325 MG tablet    Sig: Take 1-2 tablets by mouth daily as needed for moderate pain.    Dispense:  10 tablet    Refill:  0      Procedures: No procedures performed   Clinical Data: No additional findings.   Subjective: Chief Complaint  Patient presents with  . Left Knee - Pain    HPI patient is a pleasant 69 year old female who comes in today with left knee and posterior thigh pain.  She notes that she fell onto the anterior knee approximately a week and a half ago.  She did not have any pain until a few days ago.  The pain she is having is to the posterior knee and runs up the back of her leg and into the proximal hamstring attachment at the ischial tuberosity.  The pain is constant but worse when she goes from a seated to standing position as well as with knee extension and straight leg raise.  She has been taking  tramadol and gabapentin with mild relief of symptoms.  She does note that she is unable to take NSAIDs due to a history of renal failure.  She denies any numbness, tingling or burning to her left lower extremity.  She notes that there is not any ecchymosis following the injury.  Review of Systems as detailed in HPI.  All others reviewed and are negative.   Objective: Vital Signs: There were no vitals taken for this visit.  Physical Exam well-developed well-nourished female no acute distress.  Alert and oriented x3.  Ortho Exam examination of her left lower extremity reveals no knee effusion.  No joint line tenderness.  She does have tenderness to the hamstrings.  There is no ecchymosis.  She has pain with resisted knee extension.  Calf is soft and nontender.  Negative Homans.  She is neurovascular intact distally.  Negative logroll.  Specialty Comments:  No specialty comments available.  Imaging: XR KNEE 3 VIEW LEFT  Result Date: 12/31/2019 X-rays demonstrate moderate joint space narrowing medial compartment.  Otherwise, no acute findings    PMFS History: Patient Active Problem List   Diagnosis Date Noted  . Extensor tenosynovitis of left wrist 03/05/2019  . Trigger finger, left middle finger  03/05/2019  . Hamstring tendinitis of right thigh 03/05/2019  . Unilateral primary osteoarthritis, left knee 05/08/2017  . Trigger finger, left ring finger 05/08/2017  . Trigger finger, right ring finger 10/23/2016  . Other chest pain 04/14/2015  . Type 2 diabetes mellitus without complication ([facility]) [id number]  . Hyperlipidemia 04/14/2015  . Essential hypertension 04/14/2015  . Epigastric pain 04/14/2015  . Palpitations 04/14/2015   Past Medical History:  Diagnosis Date  . Anxiety   . Arthritis    back, knees  . Cyst of buttocks 02/2018   right  . Dental crowns present   . FSGS (focal segmental glomerulosclerosis)    states currently in remission  . GERD (gastroesophageal reflux  disease)   . History of Bell's palsy   . History of shingles   . Hypertension    states under control with med., has been on med. x 30 yr.  . Insulin dependent diabetes mellitus   . Neuropathy    feet  . Seasonal allergies     History reviewed. No pertinent family history.  Past Surgical History:  Procedure Laterality Date  . ABDOMINAL HYSTERECTOMY     complete  . CARPAL TUNNEL RELEASE Bilateral   . FOOT SURGERY Bilateral   . MASS EXCISION Right 03/29/2018   Procedure: EXCISION OF RIGHT BUTTOCK CYST;  Surgeon: Johnathan Hausen, MD;  Location: [facility];  Service: General;  Laterality: Right;  . NASAL SEPTUM SURGERY    . STERIOD INJECTION Left 05/23/2017   Procedure: LEFT TRIGGER FINGER INJECTION;  Surgeon: Leandrew Koyanagi, MD;  Location: [facility];  Service: Orthopedics;  Laterality: Left;  . TONSILLECTOMY    . TRIGGER FINGER RELEASE Right 05/23/2017   Procedure: RIGHT RING FINGER RELEASE TRIGGER FINGER AND TENOLYSIS, LEFT RING FINGER TRIGGER FINGER INJECTION;  Surgeon: Leandrew Koyanagi, MD;  Location: [facility];  Service: Orthopedics;  Laterality: Right;  . TRIGGER FINGER RELEASE Left 12/26/2017   Procedure: RELEASE TRIGGER FINGER LEFT RING FINGER;  Surgeon: Leandrew Koyanagi, MD;  Location: [facility];  Service: Orthopedics;  Laterality: Left;  . TRIGGER FINGER RELEASE Left 06/11/2019   Procedure: RELEASE TRIGGER FINGER LEFT MIDDLE FINGER;  Surgeon: Leandrew Koyanagi, MD;  Location: [facility];  Service: Orthopedics;  Laterality: Left;   Social History   Occupational History  . Not on file  Tobacco Use  . Smoking status: Never Smoker  . Smokeless tobacco: Never Used  Substance and Sexual Activity  . Alcohol use: No  . Drug use: No  . Sexual activity: Not on file

## 2020-01-01 ENCOUNTER — Ambulatory Visit: Payer: Medicare Other | Attending: Internal Medicine

## 2020-01-01 DIAGNOSIS — Z23 Encounter for immunization: Secondary | ICD-10-CM | POA: Insufficient documentation

## 2020-01-01 NOTE — Progress Notes (Signed)
   Covid-19 Vaccination Clinic  Name:  Paula Richard    MRN: [id number] DOB: 04-Dec-1950  01/01/2020  [name] was observed post Covid-19 immunization for 15 minutes without incidence. She was provided with Vaccine Information Sheet and instruction to access the V-Safe system.   [name] was instructed to call 911 with any severe reactions post vaccine: Marland Kitchen Difficulty breathing  . Swelling of your face and throat  . A fast heartbeat  . A bad rash all over your body  . Dizziness and weakness    Immunizations Administered    Name Date Dose VIS Date Route   Pfizer COVID-19 Vaccine 01/01/2020  9:15 AM 0.3 mL 11/07/2019 Intramuscular   Manufacturer: [place]   Lot: [id number]   [place]: [id number]

## 2020-01-09 ENCOUNTER — Ambulatory Visit (INDEPENDENT_AMBULATORY_CARE_PROVIDER_SITE_OTHER): Payer: Medicare Other | Admitting: Rehabilitative and Restorative Service Providers"

## 2020-01-09 ENCOUNTER — Encounter: Payer: Self-pay | Admitting: Rehabilitative and Restorative Service Providers"

## 2020-01-09 ENCOUNTER — Other Ambulatory Visit: Payer: Self-pay

## 2020-01-09 DIAGNOSIS — R600 Localized edema: Secondary | ICD-10-CM

## 2020-01-09 DIAGNOSIS — R262 Difficulty in walking, not elsewhere classified: Secondary | ICD-10-CM

## 2020-01-09 DIAGNOSIS — M6281 Muscle weakness (generalized): Secondary | ICD-10-CM

## 2020-01-09 DIAGNOSIS — M79605 Pain in left leg: Secondary | ICD-10-CM | POA: Diagnosis not present

## 2020-01-09 NOTE — Therapy (Signed)
[facility] [address] [place], [place], [address] Phone: [phone]   Fax:  [phone]  Physical Therapy Evaluation  Patient Details  Name: IJEOMA CRISANTO MRN: [id number] Date of Birth: [birth year] Referring Provider (PT): Dwana Melena PA-C   Encounter Date: 01/09/2020  PT End of Session - 01/09/20 1453    Visit Number  1    Number of Visits  12    Date for PT Re-Evaluation  02/13/20    PT Start Time  [id number]    PT Stop Time  1450    PT Time Calculation (min)  53 min    Activity Tolerance  Patient limited by pain    Behavior During Therapy  [facility] for tasks assessed/performed       Past Medical History:  Diagnosis Date  . Anxiety   . Arthritis    back, knees  . Cyst of buttocks 02/2018   right  . Dental crowns present   . FSGS (focal segmental glomerulosclerosis)    states currently in remission  . GERD (gastroesophageal reflux disease)   . History of Bell's palsy   . History of shingles   . Hypertension    states under control with med., has been on med. x 30 yr.  . Insulin dependent diabetes mellitus   . Neuropathy    feet  . Seasonal allergies     Past Surgical History:  Procedure Laterality Date  . ABDOMINAL HYSTERECTOMY     complete  . CARPAL TUNNEL RELEASE Bilateral   . FOOT SURGERY Bilateral   . MASS EXCISION Right 03/29/2018   Procedure: EXCISION OF RIGHT BUTTOCK CYST;  Surgeon: Johnathan Hausen, MD;  Location: [place];  Service: General;  Laterality: Right;  . NASAL SEPTUM SURGERY    . STERIOD INJECTION Left 05/23/2017   Procedure: LEFT TRIGGER FINGER INJECTION;  Surgeon: Leandrew Koyanagi, MD;  Location: [place];  Service: Orthopedics;  Laterality: Left;  . TONSILLECTOMY    . TRIGGER FINGER RELEASE Right 05/23/2017   Procedure: RIGHT RING FINGER RELEASE TRIGGER FINGER AND TENOLYSIS, LEFT RING FINGER TRIGGER FINGER INJECTION;  Surgeon: Leandrew Koyanagi, MD;  Location: [place];  Service: Orthopedics;  Laterality: Right;  . TRIGGER FINGER RELEASE Left 12/26/2017   Procedure: RELEASE TRIGGER FINGER LEFT RING FINGER;  Surgeon: Leandrew Koyanagi, MD;  Location: [place];  Service: Orthopedics;  Laterality: Left;  . TRIGGER FINGER RELEASE Left 06/11/2019   Procedure: RELEASE TRIGGER FINGER LEFT MIDDLE FINGER;  Surgeon: Leandrew Koyanagi, MD;  Location: [place];  Service: Orthopedics;  Laterality: Left;    There were no vitals filed for this visit.   Subjective Assessment - 01/09/20 1404    Subjective  Pt. indicated history of R sided hamstring complaints in past and now is having some complaints on L side and having pain in posterior thigh and behind knee.  Pt. indicated onset a few weeks ago, about 1 week after a fall when working under house.    Currently in Pain?  Yes    Pain Score  7    pain at worst 10/10   Pain Location  Leg    Pain Orientation  Left    Pain Descriptors / Indicators  Aching    Pain Type  Acute pain    Pain Onset  1 to 4 weeks ago    Pain Frequency  Intermittent    Aggravating Factors   Standing, walking,  sitting prolonged    Pain Relieving Factors  Medicine at times.    Effect of Pain on Daily Activities  walking for exercise, sitting, standing.         [facility] PT Assessment - 01/09/20 0001      Assessment   Medical Diagnosis  L Hamstring Strain    Referring Provider (PT)  Dwana Melena PA-C    Onset Date/Surgical Date  12/29/19    Hand Dominance  Right    Prior Therapy  Therapy on neck, legs in past      Precautions   Precautions  None      Restrictions   Weight Bearing Restrictions  No      Balance Screen   Has the patient fallen in the past 6 months  No    Has the patient had a decrease in activity level because of a fear of falling?   No    Is the patient reluctant to leave their home because of a fear of falling?   No      Home Environment   Living Environment  Private residence     Living Arrangements  Spouse/significant other    Type of [place] to enter      Prior Function   Level of Independence  Independent    Vocation  Retired    Leisure  Walking for exercise, yard work      Charity fundraiser Status  Within [employer] for tasks assessed      Observation/Other Assessments-Edema    Edema  --   Mild posterior knee effusion noted, similar to [employer] Cyst     Functional Tests   Functional tests  Squat;Single leg stance      Single Leg Stance   Comments  Lt SLS: c pain 6 seconds.  Rt SLS: 10 seconds      Posture/Postural Control   Posture Comments  Mild Weight shift to RLE in stance      ROM / Strength   AROM / PROM / Strength  AROM;PROM;Strength      AROM   AROM Assessment Site  Knee;Lumbar    Right/Left Knee  Left;Right    Right Knee Extension  0    Left Knee Extension  0   Concordant posterior knee complaints at end range   Lumbar Flexion  mid shin, mild relief of symptoms     Lumbar Extension   25% WFL no change       PROM   PROM Assessment Site  Knee    Right/Left Knee  Left;Right    Left Knee Extension  0    Left Knee Flexion  --   WFL c end range posterior knee concordant sign     Strength   Strength Assessment Site  Hip;Knee;Ankle    Right/Left Hip  Right;Left    Right Hip Flexion  5/5    Left Hip Flexion  5/5    Right/Left Knee  Left;Right    Right Knee Flexion  5/5    Right Knee Extension  5/5    Left Knee Flexion  4/5   Pain posterior knee   Left Knee Extension  5/5    Right/Left Ankle  Right;Left    Right Ankle Dorsiflexion  5/5    Right Ankle Plantar Flexion  5/5    Right Ankle Inversion  5/5    Right Ankle Eversion  5/5    Left Ankle  Dorsiflexion  5/5    Left Ankle Plantar Flexion  5/5    Left Ankle Inversion  5/5    Left Ankle Eversion  5/5      Flexibility   Soft Tissue Assessment /Muscle Length  yes    Hamstrings  Lacking 30 deg on hamstring 90/90      Palpation    Palpation comment  Palpation tenderness in posterior Lt knee, middle and distal lateral hamstring      Special Tests    Special Tests  Lumbar;Knee Special Tests    Lumbar Tests  Slump Test;Straight Leg Raise      Slump test   Findings  Positive    Side  Left      Straight Leg Raise   Findings  Positive    Side   Left    Comment  passive SLR to 40 deg      Ambulation/Gait   Gait Pattern  Within Functional Limits                Objective measurements completed on examination: See above findings.      [place] Adult PT Treatment/Exercise - 01/09/20 0001      Exercises   Exercises  Lumbar;Knee/Hip      Lumbar Exercises: Stretches   Single Knee to Chest Stretch  Left;Right;5 reps   15 seconds     Knee/Hip Exercises: Seated   Other Seated Knee/Hip Exercises  Seated sciatic nerve flossing 3 x 10 bilat      Knee/Hip Exercises: Supine   Straight Leg Raises  AROM;Strengthening;Both;3 sets;10 reps      Manual Therapy   Manual therapy comments  Sciatic nerve flossing, PROM c mobilization c movement to L knee, passive hamstring stretch             PT Education - 01/09/20 1408    Education Details  HEP, POC    Person(s) Educated  Patient    Methods  Explanation;Demonstration;Verbal cues;Handout    Comprehension  Returned demonstration;Verbalized understanding       PT Short Term Goals - 01/09/20 1455      PT SHORT TERM GOAL #1   Title  Patient will demonstrate independent use of home exercise program to maintain progress from in clinic treatments.    Time  2    Period  Weeks    Status  New    Target Date  01/23/20        PT Long Term Goals - 01/09/20 1455      PT LONG TERM GOAL #1   Title  Patient will demonstrate/report pain at worst less than or equal to 2/10 to facilitate minimal limitation in daily activity secondary to pain symptoms.    Time  12    Period  Weeks    Status  New    Target Date  04/02/20      PT LONG TERM GOAL #2   Title   Patient will demonstrate independent use of home exercise program to facilitate ability to maintain/progress functional gains from skilled physical therapy services.    Time  12    Period  Weeks    Status  New    Target Date  04/02/20      PT LONG TERM GOAL #3   Title  Patient will demonstrate Lt knee AROM WFL to facilitate ability to perform transfers, sitting, ambulation, stair navigation s restriction due to mobility.    Time  12    Period  Weeks  Status  New    Target Date  04/02/20      PT LONG TERM GOAL #4   Title  Pt. will demonstrate/report ability to sit and stand s restriction due to symptoms to facilitate usual daily activity at home s restriction.    Time  12    Period  Weeks    Status  New    Target Date  04/02/20      PT LONG TERM GOAL #5   Title  Pt. will demonstrate passive SLR Lt equal to Rt > 60 degress to facilitate mobility at PLOF.    Time  12    Period  Weeks    Status  New    Target Date  04/02/20             Plan - 01/09/20 1458    Clinical Impression Statement  Patient is a 69 y.o. female who comes to clinic with complaints of LLE pain with mobility, strength and movement coordination deficits that impair her ability to perform usual daily and recreational functional activities without increase difficulty/symptoms at this time.  Patient to benefit from skilled PT services to address impairments and limitations to improve to previous level of function without restriction secondary to condition.    Personal Factors and Comorbidities  Age;Comorbidity 2    Comorbidities  HTN, DM    Examination-Activity Limitations  Sit;Squat;Stairs;Stand    Examination-Participation Restrictions  Yard Work;Community Activity    Stability/Clinical Decision Making  Evolving/Moderate complexity    Clinical Decision Making  Moderate    Rehab Potential  Good    PT Frequency  2x / week    PT Duration  12 weeks    PT Treatment/Interventions  ADLs/Self Care Home  Management;Electrical Stimulation;Ultrasound;Traction;Moist Heat;Iontophoresis 4mg /ml Dexamethasone;Gait training;Stair training;Functional mobility training;Therapeutic activities;Therapeutic exercise;Balance training;Neuromuscular re-education;Manual techniques;Patient/family education;Passive range of motion;Dry needling;Joint Manipulations;Spinal Manipulations;Vasopneumatic Device;Taping    PT Next Visit Plan  Review HEP, myofascial trigger point release    PT Home Exercise Plan  [id number]    Consulted and Agree with Plan of Care  Patient       Patient will benefit from skilled therapeutic intervention in order to improve the following deficits and impairments:  Decreased range of motion, Difficulty walking, Decreased activity tolerance, Pain, Impaired flexibility, Decreased balance, Decreased mobility, Decreased strength, Increased edema  Visit Diagnosis: Pain in left leg - Plan: PT plan of care cert/re-cert  Muscle weakness (generalized) - Plan: PT plan of care cert/re-cert  Difficulty in walking, not elsewhere classified - Plan: PT plan of care cert/re-cert  Localized edema - Plan: PT plan of care cert/re-cert     Problem List Patient Active Problem List   Diagnosis Date Noted  . Extensor tenosynovitis of left wrist 03/05/2019  . Trigger finger, left middle finger 03/05/2019  . Hamstring tendinitis of right thigh 03/05/2019  . Unilateral primary osteoarthritis, left knee 05/08/2017  . Trigger finger, left ring finger 05/08/2017  . Trigger finger, right ring finger 10/23/2016  . Other chest pain 04/14/2015  . Type 2 diabetes mellitus without complication ([place]) [id number]  . Hyperlipidemia 04/14/2015  . Essential hypertension 04/14/2015  . Epigastric pain 04/14/2015  . Palpitations 04/14/2015    Scot Jun, PT, DPT, OCS, ATC 01/09/20  3:05 PM    [facility] [address] [place], [place], [address] Phone: [phone]   Fax:   [phone]  Name: RUT GILPATRICK MRN: [id number] Date of Birth: 1951-09-13

## 2020-01-09 NOTE — Patient Instructions (Signed)
Access Code: [email or web address] URL: [email or web address] Date: 01/09/2020 Prepared by: Scot Jun  Exercises Small Range Straight Leg Raise - 10 reps - 3 sets - 2x daily - 7x weekly Hooklying Single Knee to Chest Stretch - 10 reps - 3 sets - 2x daily - 7x weekly

## 2020-01-12 ENCOUNTER — Ambulatory Visit (INDEPENDENT_AMBULATORY_CARE_PROVIDER_SITE_OTHER): Payer: Medicare Other | Admitting: Rehabilitative and Restorative Service Providers"

## 2020-01-12 ENCOUNTER — Other Ambulatory Visit: Payer: Self-pay

## 2020-01-12 ENCOUNTER — Encounter: Payer: Self-pay | Admitting: Rehabilitative and Restorative Service Providers"

## 2020-01-12 DIAGNOSIS — M79605 Pain in left leg: Secondary | ICD-10-CM | POA: Diagnosis not present

## 2020-01-12 DIAGNOSIS — M6281 Muscle weakness (generalized): Secondary | ICD-10-CM | POA: Diagnosis not present

## 2020-01-12 DIAGNOSIS — R262 Difficulty in walking, not elsewhere classified: Secondary | ICD-10-CM

## 2020-01-12 DIAGNOSIS — R600 Localized edema: Secondary | ICD-10-CM

## 2020-01-12 NOTE — Therapy (Signed)
[place], [place], [address] Phone: [phone]   Fax:  [phone]  Physical Therapy Treatment  Patient Details  Name: Paula Richard MRN: [id number] Date of Birth: 1951-11-14 Referring Provider (PT): Dwana Melena PA-C   Encounter Date: 01/12/2020  PT End of Session - 01/12/20 0926    Visit Number  2    Number of Visits  12    Date for PT Re-Evaluation  02/13/20    PT Start Time  0846    PT Stop Time  0930    PT Time Calculation (min)  44 min    Activity Tolerance  Patient limited by pain    Behavior During Therapy  [facility] for tasks assessed/performed       Past Medical History:  Diagnosis Date  . Anxiety   . Arthritis    back, knees  . Cyst of buttocks 02/2018   right  . Dental crowns present   . FSGS (focal segmental glomerulosclerosis)    states currently in remission  . GERD (gastroesophageal reflux disease)   . History of Bell's palsy   . History of shingles   . Hypertension    states under control with med., has been on med. x 30 yr.  . Insulin dependent diabetes mellitus   . Neuropathy    feet  . Seasonal allergies     Past Surgical History:  Procedure Laterality Date  . ABDOMINAL HYSTERECTOMY     complete  . CARPAL TUNNEL RELEASE Bilateral   . FOOT SURGERY Bilateral   . MASS EXCISION Right 03/29/2018   Procedure: EXCISION OF RIGHT BUTTOCK CYST;  Surgeon: Johnathan Hausen, MD;  Location: [place];  Service: General;  Laterality: Right;  . NASAL SEPTUM SURGERY    . STERIOD INJECTION Left 05/23/2017   Procedure: LEFT TRIGGER FINGER INJECTION;  Surgeon: Leandrew Koyanagi, MD;  Location: [place];  Service: Orthopedics;  Laterality: Left;  . TONSILLECTOMY    . TRIGGER FINGER RELEASE Right 05/23/2017   Procedure: RIGHT RING FINGER RELEASE TRIGGER FINGER AND TENOLYSIS, LEFT RING FINGER TRIGGER FINGER INJECTION;  Surgeon: Leandrew Koyanagi, MD;  Location: [place];  Service: Orthopedics;  Laterality: Right;  . TRIGGER FINGER RELEASE Left 12/26/2017   Procedure: RELEASE TRIGGER FINGER LEFT RING FINGER;  Surgeon: Leandrew Koyanagi, MD;  Location: [place];  Service: Orthopedics;  Laterality: Left;  . TRIGGER FINGER RELEASE Left 06/11/2019   Procedure: RELEASE TRIGGER FINGER LEFT MIDDLE FINGER;  Surgeon: Leandrew Koyanagi, MD;  Location: [place];  Service: Orthopedics;  Laterality: Left;    There were no vitals filed for this visit.  Subjective Assessment - 01/12/20 0851    Subjective  Pt. indicated still feeling pain, maybe a little better.  Pt. indicated feeling a little less pain in proximal thigh.    Currently in Pain?  Yes    Pain Score  6     Pain Location  Leg    Pain Orientation  Left    Pain Descriptors / Indicators  Aching    Pain Onset  1 to 4 weeks ago                       [facility] Adult PT Treatment/Exercise - 01/12/20 0001      Lumbar Exercises: Stretches   Single Knee to Chest Stretch  Left;Right;5 reps      Knee/Hip Exercises: Aerobic  Nustep  L5 10 mins      Knee/Hip Exercises: Seated   Other Seated Knee/Hip Exercises  Seated sciatic nerve flossing 3 x 10 bilat      Knee/Hip Exercises: Supine   Straight Leg Raises  AROM;Strengthening;Both;3 sets;10 reps      Modalities   Modalities  Moist Heat      Moist Heat Therapy   Number Minutes Moist Heat  5 Minutes    Moist Heat Location  Other (comment)   posterior Lt thigh in prone c knee extension stretch     Manual Therapy   Manual therapy comments  Sciatic nerve flossing.   STM/IMT c active compression to middle and distal hamstring Lt.              PT Education - 01/12/20 0904    Education Details  HEP, Education of intramuscular therapy    Person(s) Educated  Patient       PT Short Term Goals - 01/09/20 1455      PT SHORT TERM GOAL #1   Title  Patient will demonstrate independent use of home exercise program  to maintain progress from in clinic treatments.    Time  2    Period  Weeks    Status  New    Target Date  01/23/20        PT Long Term Goals - 01/09/20 1455      PT LONG TERM GOAL #1   Title  Patient will demonstrate/report pain at worst less than or equal to 2/10 to facilitate minimal limitation in daily activity secondary to pain symptoms.    Time  12    Period  Weeks    Status  New    Target Date  04/02/20      PT LONG TERM GOAL #2   Title  Patient will demonstrate independent use of home exercise program to facilitate ability to maintain/progress functional gains from skilled physical therapy services.    Time  12    Period  Weeks    Status  New    Target Date  04/02/20      PT LONG TERM GOAL #3   Title  Patient will demonstrate Lt knee AROM WFL to facilitate ability to perform transfers, sitting, ambulation, stair navigation s restriction due to mobility.    Time  12    Period  Weeks    Status  New    Target Date  04/02/20      PT LONG TERM GOAL #4   Title  Pt. will demonstrate/report ability to sit and stand s restriction due to symptoms to facilitate usual daily activity at home s restriction.    Time  12    Period  Weeks    Status  New    Target Date  04/02/20      PT LONG TERM GOAL #5   Title  Pt. will demonstrate passive SLR Lt equal to Rt > 60 degress to facilitate mobility at PLOF.    Time  12    Period  Weeks    Status  New    Target Date  04/02/20            Plan - 01/12/20 0911    Clinical Impression Statement  Localized symptoms in mid thigh reported c trigger point release techniques today.  Pt. did demonstrate mild improvement in mobility c reassessed on LLE at this time but symptoms still present and impairing daily activity.  Personal Factors and Comorbidities  Age;Comorbidity 2    Comorbidities  HTN, DM    Examination-Activity Limitations  Sit;Squat;Stairs;Stand    Examination-Participation Restrictions  Yard Work;Community Activity     Stability/Clinical Decision Making  Evolving/Moderate complexity    Rehab Potential  Good    PT Frequency  2x / week    PT Duration  12 weeks    PT Treatment/Interventions  ADLs/Self Care Home Management;Electrical Stimulation;Ultrasound;Traction;Moist Heat;Iontophoresis 4mg /ml Dexamethasone;Gait training;Stair training;Functional mobility training;Therapeutic activities;Therapeutic exercise;Balance training;Neuromuscular re-education;Manual techniques;Patient/family education;Passive range of motion;Dry needling;Joint Manipulations;Spinal Manipulations;Vasopneumatic Device;Taping    PT Next Visit Plan  Reassess manual trigger point release techniques, include if beneficial.  Continue to improve LLE mobility    PT Home Exercise Plan  [email or web address]    Consulted and Agree with Plan of Care  Patient       Patient will benefit from skilled therapeutic intervention in order to improve the following deficits and impairments:  Decreased range of motion, Difficulty walking, Decreased activity tolerance, Pain, Impaired flexibility, Decreased balance, Decreased mobility, Decreased strength, Increased edema  Visit Diagnosis: Pain in left leg  Muscle weakness (generalized)  Difficulty in walking, not elsewhere classified  Localized edema     Problem List Patient Active Problem List   Diagnosis Date Noted  . Extensor tenosynovitis of left wrist 03/05/2019  . Trigger finger, left middle finger 03/05/2019  . Hamstring tendinitis of right thigh 03/05/2019  . Unilateral primary osteoarthritis, left knee 05/08/2017  . Trigger finger, left ring finger 05/08/2017  . Trigger finger, right ring finger 10/23/2016  . Other chest pain 04/14/2015  . Type 2 diabetes mellitus without complication ([place]) [id number]  . Hyperlipidemia 04/14/2015  . Essential hypertension 04/14/2015  . Epigastric pain 04/14/2015  . Palpitations 04/14/2015    Scot Jun, PT, DPT, OCS, ATC 01/12/20  9:29 AM    [facility] [address] [place], [place], [address] Phone: [phone]   Fax:  [phone]  Name: Paula Richard MRN: [id number] Date of Birth: Feb 01, 1951

## 2020-01-16 ENCOUNTER — Encounter: Payer: Self-pay | Admitting: Rehabilitative and Restorative Service Providers"

## 2020-01-16 ENCOUNTER — Ambulatory Visit (INDEPENDENT_AMBULATORY_CARE_PROVIDER_SITE_OTHER): Payer: Medicare Other | Admitting: Rehabilitative and Restorative Service Providers"

## 2020-01-16 ENCOUNTER — Other Ambulatory Visit: Payer: Self-pay

## 2020-01-16 DIAGNOSIS — M79605 Pain in left leg: Secondary | ICD-10-CM | POA: Diagnosis not present

## 2020-01-16 DIAGNOSIS — R262 Difficulty in walking, not elsewhere classified: Secondary | ICD-10-CM | POA: Diagnosis not present

## 2020-01-16 DIAGNOSIS — R600 Localized edema: Secondary | ICD-10-CM

## 2020-01-16 DIAGNOSIS — M6281 Muscle weakness (generalized): Secondary | ICD-10-CM | POA: Diagnosis not present

## 2020-01-16 NOTE — Therapy (Signed)
[place], [place], [address] Phone: [phone]   Fax:  [phone]  Physical Therapy Treatment  Patient Details  Name: Paula Richard MRN: [id number] Date of Birth: Jul 04, 1951 Referring Provider (PT): Dwana Melena PA-C   Encounter Date: 01/16/2020  PT End of Session - 01/16/20 1428    Visit Number  3    Number of Visits  12    Date for PT Re-Evaluation  02/13/20    PT Start Time  1401    PT Stop Time  1440    PT Time Calculation (min)  39 min    Activity Tolerance  Patient limited by pain    Behavior During Therapy  [facility] for tasks assessed/performed       Past Medical History:  Diagnosis Date  . Anxiety   . Arthritis    back, knees  . Cyst of buttocks 02/2018   right  . Dental crowns present   . FSGS (focal segmental glomerulosclerosis)    states currently in remission  . GERD (gastroesophageal reflux disease)   . History of Bell's palsy   . History of shingles   . Hypertension    states under control with med., has been on med. x 30 yr.  . Insulin dependent diabetes mellitus   . Neuropathy    feet  . Seasonal allergies     Past Surgical History:  Procedure Laterality Date  . ABDOMINAL HYSTERECTOMY     complete  . CARPAL TUNNEL RELEASE Bilateral   . FOOT SURGERY Bilateral   . MASS EXCISION Right 03/29/2018   Procedure: EXCISION OF RIGHT BUTTOCK CYST;  Surgeon: Johnathan Hausen, MD;  Location: [place];  Service: General;  Laterality: Right;  . NASAL SEPTUM SURGERY    . STERIOD INJECTION Left 05/23/2017   Procedure: LEFT TRIGGER FINGER INJECTION;  Surgeon: Leandrew Koyanagi, MD;  Location: [place];  Service: Orthopedics;  Laterality: Left;  . TONSILLECTOMY    . TRIGGER FINGER RELEASE Right 05/23/2017   Procedure: RIGHT RING FINGER RELEASE TRIGGER FINGER AND TENOLYSIS, LEFT RING FINGER TRIGGER FINGER INJECTION;  Surgeon: Leandrew Koyanagi, MD;  Location: [place];  Service: Orthopedics;  Laterality: Right;  . TRIGGER FINGER RELEASE Left 12/26/2017   Procedure: RELEASE TRIGGER FINGER LEFT RING FINGER;  Surgeon: Leandrew Koyanagi, MD;  Location: [place];  Service: Orthopedics;  Laterality: Left;  . TRIGGER FINGER RELEASE Left 06/11/2019   Procedure: RELEASE TRIGGER FINGER LEFT MIDDLE FINGER;  Surgeon: Leandrew Koyanagi, MD;  Location: [place];  Service: Orthopedics;  Laterality: Left;    There were no vitals filed for this visit.  Subjective Assessment - 01/16/20 1402    Subjective  Pt. stated feeling 9/10 in back of knee primarily at this time.  Pt. stated feeling some improvement in middle thigh after last visit but has returned some.    Currently in Pain?  Yes    Pain Score  9     Pain Location  Knee    Pain Orientation  Left    Pain Descriptors / Indicators  Throbbing    Pain Type  Acute pain    Pain Onset  1 to 4 weeks ago                       [facility] Adult PT Treatment/Exercise - 01/16/20 0001      Lumbar Exercises: Stretches   Single  Knee to Chest Stretch  Left;Right;5 reps      Lumbar Exercises: Prone   Other Prone Lumbar Exercises  prone extension on elbows 2 x 10       Knee/Hip Exercises: Stretches   Gastroc Stretch  Both;30 seconds;5 reps   incline stretch     Knee/Hip Exercises: Seated   Other Seated Knee/Hip Exercises  Seated sciatic nerve flossing 3 x 10 bilat      Manual Therapy   Manual therapy comments  manual DKC to chest, MET hamstring release, sciatic nerve               PT Short Term Goals - 01/09/20 1455      PT SHORT TERM GOAL #1   Title  Patient will demonstrate independent use of home exercise program to maintain progress from in clinic treatments.    Time  2    Period  Weeks    Status  New    Target Date  01/23/20        PT Long Term Goals - 01/09/20 1455      PT LONG TERM GOAL #1   Title  Patient will demonstrate/report pain at worst less  than or equal to 2/10 to facilitate minimal limitation in daily activity secondary to pain symptoms.    Time  12    Period  Weeks    Status  New    Target Date  04/02/20      PT LONG TERM GOAL #2   Title  Patient will demonstrate independent use of home exercise program to facilitate ability to maintain/progress functional gains from skilled physical therapy services.    Time  12    Period  Weeks    Status  New    Target Date  04/02/20      PT LONG TERM GOAL #3   Title  Patient will demonstrate Lt knee AROM WFL to facilitate ability to perform transfers, sitting, ambulation, stair navigation s restriction due to mobility.    Time  12    Period  Weeks    Status  New    Target Date  04/02/20      PT LONG TERM GOAL #4   Title  Pt. will demonstrate/report ability to sit and stand s restriction due to symptoms to facilitate usual daily activity at home s restriction.    Time  12    Period  Weeks    Status  New    Target Date  04/02/20      PT LONG TERM GOAL #5   Title  Pt. will demonstrate passive SLR Lt equal to Rt > 60 degress to facilitate mobility at PLOF.    Time  12    Period  Weeks    Status  New    Target Date  04/02/20            Plan - 01/16/20 1426    Clinical Impression Statement  Presentation today indicated lumbar involvement in condition c changed symptoms noted c lumbar extension, flexion movements as well as in PAIVM assessment (hypomobility noted throughout lumbar).  Advised change to HEP to incorporate PPU which helped improve symptoms in clinic today.    Personal Factors and Comorbidities  Age;Comorbidity 2    Comorbidities  HTN, DM    Examination-Activity Limitations  Sit;Squat;Stairs;Stand    Examination-Participation Restrictions  Yard Work;Community Activity    Stability/Clinical Decision Making  Evolving/Moderate complexity    Rehab Potential  Good  PT Frequency  2x / week    PT Duration  12 weeks    PT Treatment/Interventions  ADLs/Self Care  Home Management;Electrical Stimulation;Ultrasound;Traction;Moist Heat;Iontophoresis 54m/ml Dexamethasone;Gait training;Stair training;Functional mobility training;Therapeutic activities;Therapeutic exercise;Balance training;Neuromuscular re-education;Manual techniques;Patient/family education;Passive range of motion;Dry needling;Joint Manipulations;Spinal Manipulations;Vasopneumatic Device;Taping    PT Next Visit Plan  Follow up on extension based movement in new HEP.    PT Home Exercise Plan  [id number]    Consulted and Agree with Plan of Care  Patient       Patient will benefit from skilled therapeutic intervention in order to improve the following deficits and impairments:  Decreased range of motion, Difficulty walking, Decreased activity tolerance, Pain, Impaired flexibility, Decreased balance, Decreased mobility, Decreased strength, Increased edema  Visit Diagnosis: Pain in left leg  Muscle weakness (generalized)  Difficulty in walking, not elsewhere classified  Localized edema     Problem List Patient Active Problem List   Diagnosis Date Noted  . Extensor tenosynovitis of left wrist 03/05/2019  . Trigger finger, left middle finger 03/05/2019  . Hamstring tendinitis of right thigh 03/05/2019  . Unilateral primary osteoarthritis, left knee 05/08/2017  . Trigger finger, left ring finger 05/08/2017  . Trigger finger, right ring finger 10/23/2016  . Other chest pain 04/14/2015  . Type 2 diabetes mellitus without complication ([place] 075/30/0511 . Hyperlipidemia 04/14/2015  . Essential hypertension 04/14/2015  . Epigastric pain 04/14/2015  . Palpitations 04/14/2015    MScot Jun PT, DPT, OCS, ATC 01/16/20  2:43 PM    [facility] [address] [place] [address]Phone: [phone]  Fax:  [phone] Name: Paula BUECHELEMRN: [id number] of Birth: 908-31-1952

## 2020-01-16 NOTE — Patient Instructions (Signed)
Access Code: [email or web address] URL: [email or web address] Date: 01/16/2020 Prepared by: Scot Jun  Exercises Small Range Straight Leg Raise - 10 reps - 3 sets - 2x daily - 7x weekly Hooklying Single Knee to Chest Stretch - 10 reps - 3 sets - 2x daily - 7x weekly Prone Press Up on Elbows - 10 reps - 3 sets - 2x daily - 7x weekly

## 2020-01-19 ENCOUNTER — Other Ambulatory Visit: Payer: Self-pay

## 2020-01-19 ENCOUNTER — Encounter: Payer: Self-pay | Admitting: Rehabilitative and Restorative Service Providers"

## 2020-01-19 ENCOUNTER — Ambulatory Visit (INDEPENDENT_AMBULATORY_CARE_PROVIDER_SITE_OTHER): Payer: Medicare Other | Admitting: Rehabilitative and Restorative Service Providers"

## 2020-01-19 DIAGNOSIS — M79605 Pain in left leg: Secondary | ICD-10-CM | POA: Diagnosis not present

## 2020-01-19 DIAGNOSIS — M6281 Muscle weakness (generalized): Secondary | ICD-10-CM

## 2020-01-19 DIAGNOSIS — R262 Difficulty in walking, not elsewhere classified: Secondary | ICD-10-CM | POA: Diagnosis not present

## 2020-01-19 DIAGNOSIS — R600 Localized edema: Secondary | ICD-10-CM | POA: Diagnosis not present

## 2020-01-19 NOTE — Therapy (Signed)
[facility] Physical Therapy [address] [place], [place], [address] Phone: [phone]   Fax:  [phone]  Physical Therapy Treatment  Patient Details  Name: Paula Richard MRN: [id number] Date of Birth: 05/21/1951 Referring Provider (PT): Dwana Melena PA-C   Encounter Date: 01/19/2020  PT End of Session - 01/19/20 1331    Visit Number  4    Number of Visits  12    Date for PT Re-Evaluation  02/13/20    PT Start Time  1311    PT Stop Time  1350    PT Time Calculation (min)  39 min    Activity Tolerance  Patient limited by pain    Behavior During Therapy  [facility] for tasks assessed/performed       Past Medical History:  Diagnosis Date  . Anxiety   . Arthritis    back, knees  . Cyst of buttocks 02/2018   right  . Dental crowns present   . FSGS (focal segmental glomerulosclerosis)    states currently in remission  . GERD (gastroesophageal reflux disease)   . History of Bell's palsy   . History of shingles   . Hypertension    states under control with med., has been on med. x 30 yr.  . Insulin dependent diabetes mellitus   . Neuropathy    feet  . Seasonal allergies     Past Surgical History:  Procedure Laterality Date  . ABDOMINAL HYSTERECTOMY     complete  . CARPAL TUNNEL RELEASE Bilateral   . FOOT SURGERY Bilateral   . MASS EXCISION Right 03/29/2018   Procedure: EXCISION OF RIGHT BUTTOCK CYST;  Surgeon: Johnathan Hausen, MD;  Location: [place];  Service: General;  Laterality: Right;  . NASAL SEPTUM SURGERY    . STERIOD INJECTION Left 05/23/2017   Procedure: LEFT TRIGGER FINGER INJECTION;  Surgeon: Leandrew Koyanagi, MD;  Location: [place];  Service: Orthopedics;  Laterality: Left;  . TONSILLECTOMY    . TRIGGER FINGER RELEASE Right 05/23/2017   Procedure: RIGHT RING FINGER RELEASE TRIGGER FINGER AND TENOLYSIS, LEFT RING FINGER TRIGGER FINGER INJECTION;  Surgeon: Leandrew Koyanagi, MD;  Location: [place];  Service: Orthopedics;  Laterality: Right;  . TRIGGER FINGER RELEASE Left 12/26/2017   Procedure: RELEASE TRIGGER FINGER LEFT RING FINGER;  Surgeon: Leandrew Koyanagi, MD;  Location: [place];  Service: Orthopedics;  Laterality: Left;  . TRIGGER FINGER RELEASE Left 06/11/2019   Procedure: RELEASE TRIGGER FINGER LEFT MIDDLE FINGER;  Surgeon: Leandrew Koyanagi, MD;  Location: [place];  Service: Orthopedics;  Laterality: Left;    There were no vitals filed for this visit.  Subjective Assessment - 01/19/20 1312    Subjective  Pt. indicated feeling pain increase a few hours later after last visit.  Pt. indicated doing back movements hurt back more.  Pt. stated arriving with knee pain severe.    Pain Score  9     Pain Location  Knee    Pain Orientation  Left    Pain Onset  1 to 4 weeks ago                       [facility] Adult PT Treatment/Exercise - 01/19/20 0001      Lumbar Exercises: Stretches   Single Knee to Chest Stretch  Left;Right;5 reps      Lumbar Exercises: Aerobic   Nustep  L5 10 mins  Lumbar Exercises: Standing   Other Standing Lumbar Exercises  standing lumbar extension x 10      Lumbar Exercises: Seated   Other Seated Lumbar Exercises  sciatic nerve flossing 2 x 10      Lumbar Exercises: Prone   Other Prone Lumbar Exercises  prone extension on elbows 3 x 10       Knee/Hip Exercises: Stretches   Gastroc Stretch  Both;30 seconds;5 reps      Manual Therapy   Manual therapy comments  prone cPA g2-g3 L1-L5             PT Education - 01/19/20 1324    Education Details  Review of HEP, intervention cues.  Education on back and LLE relationship (radicular symptoms possible).       PT Short Term Goals - 01/09/20 1455      PT SHORT TERM GOAL #1   Title  Patient will demonstrate independent use of home exercise program to maintain progress from in clinic treatments.    Time  2    Period  Weeks    Status  New     Target Date  01/23/20        PT Long Term Goals - 01/09/20 1455      PT LONG TERM GOAL #1   Title  Patient will demonstrate/report pain at worst less than or equal to 2/10 to facilitate minimal limitation in daily activity secondary to pain symptoms.    Time  12    Period  Weeks    Status  New    Target Date  04/02/20      PT LONG TERM GOAL #2   Title  Patient will demonstrate independent use of home exercise program to facilitate ability to maintain/progress functional gains from skilled physical therapy services.    Time  12    Period  Weeks    Status  New    Target Date  04/02/20      PT LONG TERM GOAL #3   Title  Patient will demonstrate Lt knee AROM WFL to facilitate ability to perform transfers, sitting, ambulation, stair navigation s restriction due to mobility.    Time  12    Period  Weeks    Status  New    Target Date  04/02/20      PT LONG TERM GOAL #4   Title  Pt. will demonstrate/report ability to sit and stand s restriction due to symptoms to facilitate usual daily activity at home s restriction.    Time  12    Period  Weeks    Status  New    Target Date  04/02/20      PT LONG TERM GOAL #5   Title  Pt. will demonstrate passive SLR Lt equal to Rt > 60 degress to facilitate mobility at PLOF.    Time  12    Period  Weeks    Status  New    Target Date  04/02/20            Plan - 01/19/20 1329    Clinical Impression Statement  Pt. continued to present c lumbar hypomobility at this time c localized concordant symptoms in lumbar but also reported changes in LLE symptoms during PAIVM and extension mobility (improved symptoms during course of intervention).  Additional time spent for education on HEP and symptom management for back and LLE.    Personal Factors and Comorbidities  Age;Comorbidity 2    Comorbidities  HTN,  DM    Examination-Activity Limitations  Sit;Squat;Stairs;Stand    Examination-Participation Restrictions  Yard Work;Community Activity     Stability/Clinical Decision Making  Evolving/Moderate complexity    Rehab Potential  Good    PT Frequency  2x / week    PT Duration  12 weeks    PT Treatment/Interventions  ADLs/Self Care Home Management;Electrical Stimulation;Ultrasound;Traction;Moist Heat;Iontophoresis 4mg /ml Dexamethasone;Gait training;Stair training;Functional mobility training;Therapeutic activities;Therapeutic exercise;Balance training;Neuromuscular re-education;Manual techniques;Patient/family education;Passive range of motion;Dry needling;Joint Manipulations;Spinal Manipulations;Vasopneumatic Device;Taping    PT Next Visit Plan  Reassess HEP use and progression of symptoms.    PT Home Exercise Plan  [id number]    Consulted and Agree with Plan of Care  Patient       Patient will benefit from skilled therapeutic intervention in order to improve the following deficits and impairments:  Decreased range of motion, Difficulty walking, Decreased activity tolerance, Pain, Impaired flexibility, Decreased balance, Decreased mobility, Decreased strength, Increased edema  Visit Diagnosis: Pain in left leg  Muscle weakness (generalized)  Difficulty in walking, not elsewhere classified  Localized edema     Problem List Patient Active Problem List   Diagnosis Date Noted  . Extensor tenosynovitis of left wrist 03/05/2019  . Trigger finger, left middle finger 03/05/2019  . Hamstring tendinitis of right thigh 03/05/2019  . Unilateral primary osteoarthritis, left knee 05/08/2017  . Trigger finger, left ring finger 05/08/2017  . Trigger finger, right ring finger 10/23/2016  . Other chest pain 04/14/2015  . Type 2 diabetes mellitus without complication ([place]) [id number]  . Hyperlipidemia 04/14/2015  . Essential hypertension 04/14/2015  . Epigastric pain 04/14/2015  . Palpitations 04/14/2015    Scot Jun, PT, DPT, OCS, ATC 01/19/20  1:40 PM     [facility] Physical Therapy [address] [place], [place], [address] Phone: [phone]   Fax:  [phone]  Name: NEFELI BIRT MRN: [id number] Date of Birth: 30-Sep-1951

## 2020-01-23 ENCOUNTER — Encounter: Payer: Medicare Other | Admitting: Rehabilitative and Restorative Service Providers"

## 2020-01-26 ENCOUNTER — Ambulatory Visit: Payer: Medicare Other | Attending: Internal Medicine

## 2020-01-26 DIAGNOSIS — Z794 Long term (current) use of insulin: Secondary | ICD-10-CM | POA: Diagnosis not present

## 2020-01-26 DIAGNOSIS — Z23 Encounter for immunization: Secondary | ICD-10-CM

## 2020-01-26 DIAGNOSIS — M8580 Other specified disorders of bone density and structure, unspecified site: Secondary | ICD-10-CM | POA: Diagnosis not present

## 2020-01-26 DIAGNOSIS — E1165 Type 2 diabetes mellitus with hyperglycemia: Secondary | ICD-10-CM | POA: Diagnosis not present

## 2020-01-26 DIAGNOSIS — M797 Fibromyalgia: Secondary | ICD-10-CM | POA: Diagnosis not present

## 2020-01-26 NOTE — Progress Notes (Signed)
   Covid-19 Vaccination Clinic  Name:  Paula Richard    MRN: [id number] DOB: 1951/07/01  01/26/2020  Paula Richard was observed post Covid-19 immunization for 15 minutes without incidence. She was provided with Vaccine Information Sheet and instruction to access the V-Safe system.   Paula Richard was instructed to call 911 with any severe reactions post vaccine: Marland Kitchen Difficulty breathing  . Swelling of your face and throat  . A fast heartbeat  . A bad rash all over your body  . Dizziness and weakness    Immunizations Administered    Name Date Dose VIS Date Route   Pfizer COVID-19 Vaccine 01/26/2020  1:35 PM 0.3 mL 11/07/2019 Intramuscular   Manufacturer: [place]   Lot: [id number]   [place]: [id number]

## 2020-01-27 ENCOUNTER — Other Ambulatory Visit: Payer: Self-pay

## 2020-01-27 ENCOUNTER — Encounter: Payer: Self-pay | Admitting: Rehabilitative and Restorative Service Providers"

## 2020-01-27 ENCOUNTER — Ambulatory Visit (INDEPENDENT_AMBULATORY_CARE_PROVIDER_SITE_OTHER): Payer: Medicare Other | Admitting: Rehabilitative and Restorative Service Providers"

## 2020-01-27 DIAGNOSIS — M6281 Muscle weakness (generalized): Secondary | ICD-10-CM | POA: Diagnosis not present

## 2020-01-27 DIAGNOSIS — M79605 Pain in left leg: Secondary | ICD-10-CM

## 2020-01-27 DIAGNOSIS — R600 Localized edema: Secondary | ICD-10-CM | POA: Diagnosis not present

## 2020-01-27 DIAGNOSIS — R262 Difficulty in walking, not elsewhere classified: Secondary | ICD-10-CM | POA: Diagnosis not present

## 2020-01-27 NOTE — Therapy (Addendum)
[facility] [address] [place], [place], [address] Phone: [phone]   Fax:  [phone]  Physical Therapy Treatment/Discharge  Patient Details  Name: Paula Richard MRN: [id number] Date of Birth: [birth year] Referring Provider (PT): Dwana Melena PA-C   Encounter Date: 01/27/2020   PHYSICAL THERAPY DISCHARGE SUMMARY  Visits from Start of Care: 5  Current functional level related to goals / functional outcomes: See note   Remaining deficits: See note   Education / Equipment: HEP Plan: Patient agrees to discharge.  Patient goals were partially met. Patient is being discharged due to not returning since the last visit.  ?????       PT End of Session - 01/27/20 1345    Visit Number  5    Number of Visits  12    Date for PT Re-Evaluation  02/13/20    PT Start Time  5686    PT Stop Time  1358    PT Time Calculation (min)  43 min    Activity Tolerance  Patient tolerated treatment well    Behavior During Therapy  WFL for tasks assessed/performed       Past Medical History:  Diagnosis Date  . Anxiety   . Arthritis    back, knees  . Cyst of buttocks 02/2018   right  . Dental crowns present   . FSGS (focal segmental glomerulosclerosis)    states currently in remission  . GERD (gastroesophageal reflux disease)   . History of Bell's palsy   . History of shingles   . Hypertension    states under control with med., has been on med. x 30 yr.  . Insulin dependent diabetes mellitus   . Neuropathy    feet  . Seasonal allergies     Past Surgical History:  Procedure Laterality Date  . ABDOMINAL HYSTERECTOMY     complete  . CARPAL TUNNEL RELEASE Bilateral   . FOOT SURGERY Bilateral   . MASS EXCISION Right 03/29/2018   Procedure: EXCISION OF RIGHT BUTTOCK CYST;  Surgeon: Johnathan Hausen, MD;  Location: [place];  Service: General;  Laterality: Right;  . NASAL SEPTUM SURGERY    . STERIOD INJECTION Left  05/23/2017   Procedure: LEFT TRIGGER FINGER INJECTION;  Surgeon: Leandrew Koyanagi, MD;  Location: [place];  Service: Orthopedics;  Laterality: Left;  . TONSILLECTOMY    . TRIGGER FINGER RELEASE Right 05/23/2017   Procedure: RIGHT RING FINGER RELEASE TRIGGER FINGER AND TENOLYSIS, LEFT RING FINGER TRIGGER FINGER INJECTION;  Surgeon: Leandrew Koyanagi, MD;  Location: [place];  Service: Orthopedics;  Laterality: Right;  . TRIGGER FINGER RELEASE Left 12/26/2017   Procedure: RELEASE TRIGGER FINGER LEFT RING FINGER;  Surgeon: Leandrew Koyanagi, MD;  Location: [place];  Service: Orthopedics;  Laterality: Left;  . TRIGGER FINGER RELEASE Left 06/11/2019   Procedure: RELEASE TRIGGER FINGER LEFT MIDDLE FINGER;  Surgeon: Leandrew Koyanagi, MD;  Location: [place];  Service: Orthopedics;  Laterality: Left;    There were no vitals filed for this visit.  Subjective Assessment - 01/27/20 1321    Subjective  Pt. stated feeling periods of improvement during and after visit but symptoms seem to return back to normal after a day or so.   Pt. stated pain 7/10 proximal thigh upon arrival.  8/10 behind knee.    Currently in Pain?  Yes    Pain Score  7     Pain Location  Knee    Pain Orientation  Left    Pain Descriptors / Indicators  Throbbing    Pain Type  Acute pain    Pain Onset  1 to 4 weeks ago                       [facility] Adult PT Treatment/Exercise - 01/27/20 0001      Lumbar Exercises: Aerobic   Nustep  L5 10 mins      Lumbar Exercises: Standing   Other Standing Lumbar Exercises  standing lumbar extension x 10      Lumbar Exercises: Seated   Other Seated Lumbar Exercises  sciatic nerve flossing 2 x 10      Lumbar Exercises: Supine   Other Supine Lumbar Exercises  sciatic nerve flossing x 10      Manual Therapy   Manual therapy comments  prone cPA g2-g3 L1-L5       Trigger Point Dry Needling - 01/27/20 0001    Consent  Given?  Yes    Education Handout Provided  Yes    Muscles Treated Lower Quadrant  Hamstring    Other Dry Needling  Lt proximal hamstring near origin    Hamstring Response  Twitch response elicited           PT Education - 01/27/20 1327    Education Details  Intervention cues occasoinal.    Person(s) Educated  Patient    Methods  Explanation;Demonstration;Tactile cues    Comprehension  Verbalized understanding;Returned demonstration       PT Short Term Goals - 01/09/20 1455      PT SHORT TERM GOAL #1   Title  Patient will demonstrate independent use of home exercise program to maintain progress from in clinic treatments.    Time  2    Period  Weeks    Status  New    Target Date  01/23/20        PT Long Term Goals - 01/09/20 1455      PT LONG TERM GOAL #1   Title  Patient will demonstrate/report pain at worst less than or equal to 2/10 to facilitate minimal limitation in daily activity secondary to pain symptoms.    Time  12    Period  Weeks    Status  New    Target Date  04/02/20      PT LONG TERM GOAL #2   Title  Patient will demonstrate independent use of home exercise program to facilitate ability to maintain/progress functional gains from skilled physical therapy services.    Time  12    Period  Weeks    Status  New    Target Date  04/02/20      PT LONG TERM GOAL #3   Title  Patient will demonstrate Lt knee AROM WFL to facilitate ability to perform transfers, sitting, ambulation, stair navigation s restriction due to mobility.    Time  12    Period  Weeks    Status  New    Target Date  04/02/20      PT LONG TERM GOAL #4   Title  Pt. will demonstrate/report ability to sit and stand s restriction due to symptoms to facilitate usual daily activity at home s restriction.    Time  12    Period  Weeks    Status  New    Target Date  04/02/20      PT LONG TERM GOAL #5  Title  Pt. will demonstrate passive SLR Lt equal to Rt > 60 degress to facilitate mobility  at PLOF.    Time  12    Period  Weeks    Status  New    Target Date  04/02/20            Plan - 01/27/20 1342    Clinical Impression Statement  To this point, Pt. has demonstrated possible lumbar radicular symptoms layered c localized hamstring myofascial restriction in presentation.  Lumbar radiculopathy supported by occasional concordant symptoms c PAIVM in lumbar, as well as reduction of symptoms c lumbar repeated movements in clinic.  Pt. also presents c localized concordant symptoms from trigger point palpation in proximal hamstring.    Personal Factors and Comorbidities  Age;Comorbidity 2    Comorbidities  HTN, DM    Examination-Activity Limitations  Sit;Squat;Stairs;Stand    Examination-Participation Restrictions  Yard Work;Community Activity    Stability/Clinical Decision Making  Evolving/Moderate complexity    Rehab Potential  Good    PT Frequency  2x / week    PT Duration  12 weeks    PT Treatment/Interventions  ADLs/Self Care Home Management;Electrical Stimulation;Ultrasound;Traction;Moist Heat;Iontophoresis 36m/ml Dexamethasone;Gait training;Stair training;Functional mobility training;Therapeutic activities;Therapeutic exercise;Balance training;Neuromuscular re-education;Manual techniques;Patient/family education;Passive range of motion;Dry needling;Joint Manipulations;Spinal Manipulations;Vasopneumatic Device;Taping    PT Next Visit Plan  Continue to treat c lumbar based movements to improve short term improvements to long term gains, reassess DN response.  Possible MD return referral for lumbar evaluation/imaging.    PT Home Exercise Plan  [id number]    Consulted and Agree with Plan of Care  Patient       Patient will benefit from skilled therapeutic intervention in order to improve the following deficits and impairments:  Decreased range of motion, Difficulty walking, Decreased activity tolerance, Pain, Impaired flexibility, Decreased balance, Decreased mobility, Decreased  strength, Increased edema  Visit Diagnosis: Pain in left leg  Muscle weakness (generalized)  Difficulty in walking, not elsewhere classified  Localized edema     Problem List Patient Active Problem List   Diagnosis Date Noted  . Extensor tenosynovitis of left wrist 03/05/2019  . Trigger finger, left middle finger 03/05/2019  . Hamstring tendinitis of right thigh 03/05/2019  . Unilateral primary osteoarthritis, left knee 05/08/2017  . Trigger finger, left ring finger 05/08/2017  . Trigger finger, right ring finger 10/23/2016  . Other chest pain 04/14/2015  . Type 2 diabetes mellitus without complication ([place] 011/15/5208 . Hyperlipidemia 04/14/2015  . Essential hypertension 04/14/2015  . Epigastric pain 04/14/2015  . Palpitations 04/14/2015   MScot Jun PT, DPT, OCS, ATC 04/21/20  4:00 PM      [facility] [address] [address]Phone: [phone]  Fax:  [phone] Name: CPANAYIOTA LARKINMRN: [id number] of Birth: 9March 20, 1952

## 2020-01-29 ENCOUNTER — Encounter: Payer: Medicare Other | Admitting: Rehabilitative and Restorative Service Providers"

## 2020-02-03 ENCOUNTER — Encounter: Payer: Medicare Other | Admitting: Rehabilitative and Restorative Service Providers"

## 2020-02-04 ENCOUNTER — Other Ambulatory Visit: Payer: Self-pay

## 2020-02-04 ENCOUNTER — Encounter: Payer: Self-pay | Admitting: Orthopaedic Surgery

## 2020-02-04 ENCOUNTER — Ambulatory Visit (INDEPENDENT_AMBULATORY_CARE_PROVIDER_SITE_OTHER): Payer: Medicare Other | Admitting: Orthopaedic Surgery

## 2020-02-04 ENCOUNTER — Ambulatory Visit (INDEPENDENT_AMBULATORY_CARE_PROVIDER_SITE_OTHER): Payer: Medicare Other

## 2020-02-04 VITALS — Ht 63.0 in | Wt 147.0 lb

## 2020-02-04 DIAGNOSIS — G8929 Other chronic pain: Secondary | ICD-10-CM

## 2020-02-04 DIAGNOSIS — M545 Low back pain, unspecified: Secondary | ICD-10-CM

## 2020-02-04 MED ORDER — METHOCARBAMOL 500 MG PO TABS: 500.0000 mg | ORAL_TABLET | Freq: Four times a day (QID) | ORAL | 2 refills | Status: AC | PRN

## 2020-02-04 NOTE — Progress Notes (Signed)
Office Visit Note   Patient: Paula Richard           Date of Birth: 1951-09-26           MRN: [id number] Visit Date: 02/04/2020              Requested by: Velna Hatchet, MD [address] [place],  [place] [address] PCP: Velna Hatchet, MD   Assessment & Plan: Visit Diagnoses:  1. Chronic bilateral Richard back pain, unspecified whether sciatica present     Plan: Impression is lumbar radiculopathy with failure of conservative treatment.  At this point we will need to obtain MRI of the lumbar spine.  I will call the patient with the results and likely refer her to Dr. Ernestina Patches for [facility].  Follow-Up Instructions: Return if symptoms worsen or fail to improve.   Orders:  Orders Placed This Encounter  Procedures  . XR Lumbar Spine 2-3 Views  . MR Lumbar Spine w/o contrast   Meds ordered this encounter  Medications  . methocarbamol (ROBAXIN) 500 MG tablet    Sig: Take 1 tablet (500 mg total) by mouth every 6 (six) hours as needed for muscle spasms.    Dispense:  30 tablet    Refill:  2      Procedures: No procedures performed   Clinical Data: No additional findings.   Subjective: Chief Complaint  Patient presents with  . Left Leg - Pain  . Lower Back - Pain    Paula Richard comes back today for continued left leg pain.  She was previously seen and sent to physical therapy but she has not noticed any improvement.  She continues to have radiating pain from her posterior thigh down into her left foot with numbness and tingling.  She has been taking tramadol and Robaxin for her symptoms.  Denies any bowel or bladder dysfunction.   Review of Systems   Objective: Vital Signs: Ht 5\' 3"  (1.6 m)   Wt 147 lb (66.7 kg)   BMI 26.04 kg/m   Physical Exam  Ortho Exam Left lower extremity exam shows positive sciatic tension sign.  No focal motor or sensory deficits. Specialty Comments:  No specialty comments available.  Imaging: XR Lumbar Spine 2-3 Views  Result Date:  02/04/2020 Severe L5-S1 and L4-L5 degenerative disc disease.  Diffuse lumbar spondylosis.    PMFS History: Patient Active Problem List   Diagnosis Date Noted  . Extensor tenosynovitis of left wrist 03/05/2019  . Trigger finger, left middle finger 03/05/2019  . Hamstring tendinitis of right thigh 03/05/2019  . Unilateral primary osteoarthritis, left knee 05/08/2017  . Trigger finger, left ring finger 05/08/2017  . Trigger finger, right ring finger 10/23/2016  . Other chest pain 04/14/2015  . Type 2 diabetes mellitus without complication ([place]) [id number]  . Hyperlipidemia 04/14/2015  . Essential hypertension 04/14/2015  . Epigastric pain 04/14/2015  . Palpitations 04/14/2015   Past Medical History:  Diagnosis Date  . Anxiety   . Arthritis    back, knees  . Cyst of buttocks 02/2018   right  . Dental crowns present   . FSGS (focal segmental glomerulosclerosis)    states currently in remission  . GERD (gastroesophageal reflux disease)   . History of Bell's palsy   . History of shingles   . Hypertension    states under control with med., has been on med. x 30 yr.  . Insulin dependent diabetes mellitus   . Neuropathy    feet  . Seasonal allergies  No family history on file.  Past Surgical History:  Procedure Laterality Date  . ABDOMINAL HYSTERECTOMY     complete  . CARPAL TUNNEL RELEASE Bilateral   . FOOT SURGERY Bilateral   . MASS EXCISION Right 03/29/2018   Procedure: EXCISION OF RIGHT BUTTOCK CYST;  Surgeon: Johnathan Hausen, MD;  Location: [place];  Service: General;  Laterality: Right;  . NASAL SEPTUM SURGERY    . STERIOD INJECTION Left 05/23/2017   Procedure: LEFT TRIGGER FINGER INJECTION;  Surgeon: Leandrew Koyanagi, MD;  Location: [place];  Service: Orthopedics;  Laterality: Left;  . TONSILLECTOMY    . TRIGGER FINGER RELEASE Right 05/23/2017   Procedure: RIGHT RING FINGER RELEASE TRIGGER FINGER AND TENOLYSIS, LEFT RING FINGER  TRIGGER FINGER INJECTION;  Surgeon: Leandrew Koyanagi, MD;  Location: [place];  Service: Orthopedics;  Laterality: Right;  . TRIGGER FINGER RELEASE Left 12/26/2017   Procedure: RELEASE TRIGGER FINGER LEFT RING FINGER;  Surgeon: Leandrew Koyanagi, MD;  Location: [place];  Service: Orthopedics;  Laterality: Left;  . TRIGGER FINGER RELEASE Left 06/11/2019   Procedure: RELEASE TRIGGER FINGER LEFT MIDDLE FINGER;  Surgeon: Leandrew Koyanagi, MD;  Location: [place];  Service: Orthopedics;  Laterality: Left;   Social History   Occupational History  . Not on file  Tobacco Use  . Smoking status: Never Smoker  . Smokeless tobacco: Never Used  Substance and Sexual Activity  . Alcohol use: No  . Drug use: No  . Sexual activity: Not on file

## 2020-02-05 ENCOUNTER — Encounter: Payer: Medicare Other | Admitting: Rehabilitative and Restorative Service Providers"

## 2020-02-12 ENCOUNTER — Telehealth: Payer: Self-pay | Admitting: Orthopaedic Surgery

## 2020-02-12 NOTE — Telephone Encounter (Signed)
Patient called. She says she needs something stronger than Tramadol for pain. She does not want hydrocodone. Her call back number is [phone]

## 2020-02-13 ENCOUNTER — Other Ambulatory Visit: Payer: Self-pay

## 2020-02-13 MED ORDER — ACETAMINOPHEN-CODEINE #3 300-30 MG PO TABS: 1.0000 | ORAL_TABLET | Freq: Two times a day (BID) | ORAL | 0 refills | Status: DC

## 2020-02-13 NOTE — Telephone Encounter (Signed)
We can do tylenol #3, but if she needs something long term or stronger than that, she will need to contact pcp or we can refer to pain management

## 2020-02-13 NOTE — Telephone Encounter (Signed)
Called into the pharm. Patient aware.

## 2020-02-20 ENCOUNTER — Other Ambulatory Visit: Payer: Self-pay

## 2020-02-20 ENCOUNTER — Ambulatory Visit
Admission: RE | Admit: 2020-02-20 | Discharge: 2020-02-20 | Disposition: A | Discharge status: Home / Self Care | Payer: Medicare Other | Source: Ambulatory Visit | Attending: Orthopaedic Surgery | Admitting: Orthopaedic Surgery

## 2020-02-20 DIAGNOSIS — M545 Low back pain, unspecified: Secondary | ICD-10-CM

## 2020-02-20 DIAGNOSIS — G8929 Other chronic pain: Secondary | ICD-10-CM

## 2020-02-20 DIAGNOSIS — M48061 Spinal stenosis, lumbar region without neurogenic claudication: Secondary | ICD-10-CM | POA: Diagnosis not present

## 2020-02-22 NOTE — Progress Notes (Signed)
Spoke to patient and she would like referral to [facility] for [facility] [place].

## 2020-02-23 ENCOUNTER — Other Ambulatory Visit: Payer: Self-pay

## 2020-02-23 DIAGNOSIS — M545 Low back pain, unspecified: Secondary | ICD-10-CM

## 2020-02-23 DIAGNOSIS — G8929 Other chronic pain: Secondary | ICD-10-CM

## 2020-03-11 ENCOUNTER — Other Ambulatory Visit: Payer: Medicare Other

## 2020-03-15 ENCOUNTER — Telehealth: Payer: Self-pay | Admitting: Orthopaedic Surgery

## 2020-03-15 NOTE — Telephone Encounter (Signed)
Patient called needing Rx refilled Tylenol 3.  [facility] in [place]. The number to contact patient is [phone]

## 2020-03-15 NOTE — Telephone Encounter (Signed)
If yes, please send this into the pharm. Thank you.

## 2020-03-16 ENCOUNTER — Other Ambulatory Visit: Payer: Self-pay | Admitting: Physician Assistant

## 2020-03-16 MED ORDER — ACETAMINOPHEN-CODEINE #3 300-30 MG PO TABS: 1.0000 | ORAL_TABLET | Freq: Two times a day (BID) | ORAL | 0 refills | Status: AC

## 2020-03-16 NOTE — Telephone Encounter (Signed)
I tried to send in and I;m not sure what happened.  Are you able to send in with same previous instructions?

## 2020-03-17 NOTE — Telephone Encounter (Signed)
Called pharm. They do not have Rx. I gave a verbal. Called patient. Patient aware.

## 2020-03-18 ENCOUNTER — Ambulatory Visit (INDEPENDENT_AMBULATORY_CARE_PROVIDER_SITE_OTHER): Payer: Medicare Other | Admitting: Physical Medicine and Rehabilitation

## 2020-03-18 ENCOUNTER — Encounter: Payer: Self-pay | Admitting: Physical Medicine and Rehabilitation

## 2020-03-18 ENCOUNTER — Ambulatory Visit: Payer: Self-pay

## 2020-03-18 ENCOUNTER — Other Ambulatory Visit: Payer: Self-pay

## 2020-03-18 VITALS — BP 131/76 | HR 65

## 2020-03-18 DIAGNOSIS — M5116 Intervertebral disc disorders with radiculopathy, lumbar region: Secondary | ICD-10-CM

## 2020-03-18 MED ORDER — METHYLPREDNISOLONE ACETATE 80 MG/ML IJ SUSP
40.0000 mg | Freq: Once | INTRAMUSCULAR | Status: AC
  Administered 2020-03-18: 40 mg

## 2020-03-18 NOTE — Progress Notes (Signed)
.  Numeric Pain Rating Scale and Functional Assessment Average Pain 7   In the last MONTH (on 0-10 scale) has pain interfered with the following?  1. General activity like being  able to carry out your everyday physical activities such as walking, climbing stairs, carrying groceries, or moving a chair?  Rating(7)   +Driver, -BT, -Dye Allergies.   

## 2020-03-22 NOTE — Procedures (Signed)
Lumbar Epidural Steroid Injection - Interlaminar Approach with Fluoroscopic Guidance  Patient: Paula Richard      Date of Birth: 1951/03/24 MRN: [id number] PCP: Velna Hatchet, MD      Visit Date: 03/18/2020   Universal Protocol:     Consent Given By: the patient  Position: PRONE  Additional Comments: Vital signs were monitored before and after the procedure. Patient was prepped and draped in the usual sterile fashion. The correct patient, procedure, and site was verified.   Injection Procedure Details:  Procedure Site One Meds Administered:  Meds ordered this encounter  Medications  . methylPREDNISolone acetate (DEPO-MEDROL) injection 40 mg     Laterality: Left  Location/Site:  L5-S1  Needle size: 20 G  Needle type: Tuohy  Needle Placement: Paramedian epidural  Findings:   -Comments: Excellent flow of contrast into the epidural space.  Procedure Details: Using a paramedian approach from the side mentioned above, the region overlying the inferior lamina was localized under fluoroscopic visualization and the soft tissues overlying this structure were infiltrated with 4 ml. of 1% Lidocaine without Epinephrine. The Tuohy needle was inserted into the epidural space using a paramedian approach.   The epidural space was localized using loss of resistance along with lateral and bi-planar fluoroscopic views.  After negative aspirate for air, blood, and CSF, a 2 ml. volume of Isovue-250 was injected into the epidural space and the flow of contrast was observed. Radiographs were obtained for documentation purposes.    The injectate was administered into the level noted above.   Additional Comments:  The patient tolerated the procedure well Dressing: 2 x 2 sterile gauze and Band-Aid    Post-procedure details: Patient was observed during the procedure. Post-procedure instructions were reviewed.  Patient left the clinic in stable condition.

## 2020-03-22 NOTE — Progress Notes (Signed)
Paula Richard - 69 y.o. female MRN [id number]  Date of birth: 1951/04/12  Office Visit Note: Visit Date: 03/18/2020 PCP: Velna Hatchet, MD Referred by: Velna Hatchet, MD  Subjective: Chief Complaint  Patient presents with  . Lower Back - Pain   HPI:  Paula Richard is a 69 y.o. female who comes in today At the request of Dr. Eduard Roux for left L5-S1 interlaminar epidural steroid injection.  I have seen the patient in the past for more cervical spine issues.  Her case is complicated by diabetes.  She had been followed by Dr. Eduard Roux from a orthopedic standpoint she has been having low back pain with left radicular type leg pain more of an L5 and S1 type distribution.  Failing conservative care he did obtain MRI of the lumbar spine this is reviewed with the patient and reviewed today with spine models and reviewed below in the note.  She does have more issues at L5-S1 on the right with paracentral disc herniation.  No focal nerve compression.  At L4-5 she has some lateral recess narrowing facet arthropathy.  In particular on the left at L3-4 there is intraspinal facet joint cyst which could impact the lateral recess in that region as well.  Depending on relief with epidural injection might look at epidural injection at a different level versus facet joint block.  She will continue with current medication and conservative care exercises and activity modification.  She is taking Tylenol 3.  ROS Otherwise per HPI.  Assessment & Plan: Visit Diagnoses:  1. Radiculopathy due to lumbar intervertebral disc disorder     Plan: No additional findings.   Meds & Orders:  Meds ordered this encounter  Medications  . methylPREDNISolone acetate (DEPO-MEDROL) injection 40 mg    Orders Placed This Encounter  Procedures  . XR C-ARM NO REPORT  . Epidural Steroid injection    Follow-up: Return if symptoms worsen or fail to improve.   Procedures: No procedures performed  Lumbar Epidural  Steroid Injection - Interlaminar Approach with Fluoroscopic Guidance  Patient: Paula Richard      Date of Birth: Jan 11, 1951 MRN: [id number] PCP: Velna Hatchet, MD      Visit Date: 03/18/2020   Universal Protocol:     Consent Given By: the patient  Position: PRONE  Additional Comments: Vital signs were monitored before and after the procedure. Patient was prepped and draped in the usual sterile fashion. The correct patient, procedure, and site was verified.   Injection Procedure Details:  Procedure Site One Meds Administered:  Meds ordered this encounter  Medications  . methylPREDNISolone acetate (DEPO-MEDROL) injection 40 mg     Laterality: Left  Location/Site:  L5-S1  Needle size: 20 G  Needle type: Tuohy  Needle Placement: Paramedian epidural  Findings:   -Comments: Excellent flow of contrast into the epidural space.  Procedure Details: Using a paramedian approach from the side mentioned above, the region overlying the inferior lamina was localized under fluoroscopic visualization and the soft tissues overlying this structure were infiltrated with 4 ml. of 1% Lidocaine without Epinephrine. The Tuohy needle was inserted into the epidural space using a paramedian approach.   The epidural space was localized using loss of resistance along with lateral and bi-planar fluoroscopic views.  After negative aspirate for air, blood, and CSF, a 2 ml. volume of Isovue-250 was injected into the epidural space and the flow of contrast was observed. Radiographs were obtained for documentation purposes.    The  injectate was administered into the level noted above.   Additional Comments:  The patient tolerated the procedure well Dressing: 2 x 2 sterile gauze and Band-Aid    Post-procedure details: Patient was observed during the procedure. Post-procedure instructions were reviewed.  Patient left the clinic in stable condition.    Clinical History: MRI LUMBAR SPINE  WITHOUT CONTRAST  TECHNIQUE: Multiplanar, multisequence MR imaging of the lumbar spine was performed. No intravenous contrast was administered.  COMPARISON:  None.  FINDINGS: Segmentation:  Standard.  Alignment:  Physiologic.  Vertebrae:  No fracture, evidence of discitis, or bone lesion.  Conus medullaris and cauda equina: Conus extends to the T12 level. Conus and cauda equina appear normal.  Paraspinal and other soft tissues: No acute paraspinal abnormality.  Disc levels:  Disc spaces: Degenerative disease with disc height loss at L4-5 and L5-S1  T12-L1: No significant disc bulge. No evidence of neural foraminal stenosis. No central canal stenosis.  L1-L2: Mild broad-based disc bulge. Mild bilateral facet arthropathy. No evidence of neural foraminal stenosis. No central canal stenosis.  L2-L3: Mild broad-based disc bulge. Mild bilateral facet arthropathy. No evidence of neural foraminal stenosis. No central canal stenosis.  L3-L4: Broad-based disc bulge. Moderate bilateral facet arthropathy with a left facet effusion. 9 mm left facet intraspinal synovial cyst. Mild spinal stenosis. No evidence of neural foraminal stenosis.  L4-L5: Broad-based disc osteophyte complex with a small broad central disc protrusion. Mild bilateral facet arthropathy. Bilateral subarticular recess stenosis. Mild spinal stenosis. Mild bilateral foraminal narrowing.  L5-S1: Broad-based disc bulge with a right paracentral disc protrusion which contacts and posteriorly displaces the right S1 nerve root. Mild bilateral foraminal narrowing. No central canal stenosis.  IMPRESSION: 1. At L5-S1 there is a broad-based disc bulge with a right paracentral disc protrusion which contacts and posteriorly displaces the right S1 nerve root. Mild bilateral foraminal narrowing. 2. At L4-5 there is a broad-based disc osteophyte complex with a small broad central disc protrusion. Mild  bilateral facet arthropathy. Bilateral subarticular recess stenosis. Mild spinal stenosis. Mild bilateral foraminal narrowing. 3. Lumbar spine spondylosis as described above.   Electronically Signed   By: Kathreen Devoid   On: 02/20/2020 11:09     Objective:  VS:  HT:    WT:   BMI:     BP:131/76  HR:65bpm  TEMP: ( )  RESP:  Physical Exam  Ortho Exam Imaging: No results found.

## 2020-03-23 ENCOUNTER — Telehealth: Payer: Self-pay | Admitting: Physical Medicine and Rehabilitation

## 2020-03-23 ENCOUNTER — Encounter: Payer: Self-pay | Admitting: Radiology

## 2020-03-23 NOTE — Telephone Encounter (Signed)
Called patient to advise. She is using cream and oral benadryl. I have added chlorhexidine as an allergy.

## 2020-03-23 NOTE — Telephone Encounter (Signed)
Must be to chlorhexadine in sterile solution. Make not in chart for mild allergy (RASH) next time we can just use alcohol. For now use hydrocort cream or oint TID and oral benadryl if itch etc.

## 2020-03-23 NOTE — Telephone Encounter (Signed)
Patient states that she has a rash on her lower back following her L5-S1 IL last week. She had a similar reaction in 2019 with her cervical IL. Please advise.

## 2020-04-14 ENCOUNTER — Other Ambulatory Visit: Payer: Self-pay | Admitting: Physician Assistant

## 2020-04-15 ENCOUNTER — Telehealth: Payer: Self-pay | Admitting: *Deleted

## 2020-04-15 ENCOUNTER — Telehealth: Payer: Self-pay | Admitting: Orthopaedic Surgery

## 2020-04-15 MED ORDER — ACETAMINOPHEN-CODEINE #3 300-30 MG PO TABS: 1.0000 | ORAL_TABLET | Freq: Every day | ORAL | 0 refills | Status: AC | PRN

## 2020-04-15 NOTE — Telephone Encounter (Signed)
Pt is scheduled for S1 tf esi vs piriformis injection 05/13/20

## 2020-04-15 NOTE — Telephone Encounter (Signed)
With MRI finding more on the right and pain on the left it may not be spine related, options from me would be either S1 tf esi vs piriformis injection. Could always get Dr. Erlinda Hong f/up

## 2020-04-15 NOTE — Telephone Encounter (Signed)
Pls advise. Thanks.  

## 2020-04-15 NOTE — Telephone Encounter (Signed)
Patient called advised she is out of her medication (Tylenol 3)  Patient uses [facility] [address] Ph# [phone] The number to contact patient is [phone]

## 2020-05-13 ENCOUNTER — Encounter: Payer: Self-pay | Admitting: Physical Medicine and Rehabilitation

## 2020-05-13 ENCOUNTER — Other Ambulatory Visit: Payer: Self-pay

## 2020-05-13 ENCOUNTER — Ambulatory Visit: Payer: Self-pay

## 2020-05-13 ENCOUNTER — Ambulatory Visit (INDEPENDENT_AMBULATORY_CARE_PROVIDER_SITE_OTHER): Payer: Medicare Other | Admitting: Physical Medicine and Rehabilitation

## 2020-05-13 VITALS — BP 142/71 | HR 59

## 2020-05-13 DIAGNOSIS — M5416 Radiculopathy, lumbar region: Secondary | ICD-10-CM | POA: Diagnosis not present

## 2020-05-13 DIAGNOSIS — M5116 Intervertebral disc disorders with radiculopathy, lumbar region: Secondary | ICD-10-CM

## 2020-05-13 MED ORDER — METHYLPREDNISOLONE ACETATE 80 MG/ML IJ SUSP
80.0000 mg | Freq: Once | INTRAMUSCULAR | Status: AC
  Administered 2020-05-13: 80 mg

## 2020-05-13 NOTE — Progress Notes (Signed)
Pt states pain in the left side of the lower back that radiates into the left leg all the way down. Pt states last injection 03/18/20 helped out a couple of weeks. Pt states any movement makes pain worse. Tylenol 3 helps with pain.  .Numeric Pain Rating Scale and Functional Assessment Average Pain 7   In the last MONTH (on 0-10 scale) has pain interfered with the following?  1. General activity like being  able to carry out your everyday physical activities such as walking, climbing stairs, carrying groceries, or moving a chair?  Rating(8)   +Driver, -BT, -Dye Allergies (Allergic to Chlorhexidine)

## 2020-05-13 NOTE — Procedures (Signed)
Lumbosacral Transforaminal Epidural Steroid Injection - Sub-Pedicular Approach with Fluoroscopic Guidance  Patient: Paula Richard      Date of Birth: 1951/06/18 MRN: [id number] PCP: Velna Hatchet, MD      Visit Date: 05/13/2020   Universal Protocol:    Date/Time: 05/13/2020  Consent Given By: the patient  Position: PRONE  Additional Comments: Vital signs were monitored before and after the procedure. Patient was prepped and draped in the usual sterile fashion. The correct patient, procedure, and site was verified.   Injection Procedure Details:  Procedure Site One Meds Administered:  Meds ordered this encounter  Medications  . methylPREDNISolone acetate (DEPO-MEDROL) injection 80 mg    Laterality: Right  Location/Site:  L5-S1  Needle size: 22 G  Needle type: Spinal  Needle Placement: Transforaminal  Findings:    -Comments: Excellent flow of contrast along the nerve, nerve root and into the epidural space.  Procedure Details: After squaring off the end-plates to get a true AP view, the C-arm was positioned so that an oblique view of the foramen as noted above was visualized. The target area is just inferior to the "nose of the scotty dog" or sub pedicular. The soft tissues overlying this structure were infiltrated with 2-3 ml. of 1% Lidocaine without Epinephrine.  The spinal needle was inserted toward the target using a "trajectory" view along the fluoroscope beam.  Under AP and lateral visualization, the needle was advanced so it did not puncture dura and was located close the 6 O'Clock position of the pedical in AP tracterory. Biplanar projections were used to confirm position. Aspiration was confirmed to be negative for CSF and/or blood. A 1-2 ml. volume of Isovue-250 was injected and flow of contrast was noted at each level. Radiographs were obtained for documentation purposes.   After attaining the desired flow of contrast documented above, a 0.5 to 1.0 ml  test dose of 0.25% Marcaine was injected into each respective transforaminal space.  The patient was observed for 90 seconds post injection.  After no sensory deficits were reported, and normal lower extremity motor function was noted,   the above injectate was administered so that equal amounts of the injectate were placed at each foramen (level) into the transforaminal epidural space.   Additional Comments:  The patient tolerated the procedure well Dressing: 2 x 2 sterile gauze and Band-Aid    Post-procedure details: Patient was observed during the procedure. Post-procedure instructions were reviewed.  Patient left the clinic in stable condition.

## 2020-05-13 NOTE — Progress Notes (Signed)
Paula Richard - 69 y.o. female MRN [id number]  Date of birth: [birth year]  Office Visit Note: Visit Date: 05/13/2020 PCP: Velna Hatchet, MD Referred by: Velna Hatchet, MD  Subjective: Chief Complaint  Patient presents with  . Lower Back - Pain  . Left Leg - Pain   HPI:  Paula Richard is a 69 y.o. female who comes in today for planned repeat Left L5-S1 Lumbar epidural steroid injection with fluoroscopic guidance.  The patient has failed conservative care including home exercise, medications, time and activity modification.  This injection will be diagnostic and hopefully therapeutic.  Please see requesting physician notes for further details and justification. Patient received more than 50% pain relief from prior injection.   Referring: Dr. Eduard Roux  MRI reviewed with images and spine model.  MRI reviewed in the note below.  Looking at the images with the patient today there is clearly a left-sided paracentral to foraminal component of the disc herniation.  She is getting some right-sided complaints.  We talked about spine surgery evaluation potentially.  Her husband has had back surgery by Dr. Vertell Limber in the past.  We will see how she does with the injection and make appropriate referral order through Dr. Eduard Roux.    ROS Otherwise per HPI.  Assessment & Plan: Visit Diagnoses:  1. Lumbar radiculopathy   2. Radiculopathy due to lumbar intervertebral disc disorder     Plan: No additional findings.   Meds & Orders:  Meds ordered this encounter  Medications  . methylPREDNISolone acetate (DEPO-MEDROL) injection 80 mg    Orders Placed This Encounter  Procedures  . XR C-ARM NO REPORT  . Epidural Steroid injection    Follow-up: Return if symptoms worsen or fail to improve.   Procedures: No procedures performed  Lumbosacral Transforaminal Epidural Steroid Injection - Sub-Pedicular Approach with Fluoroscopic Guidance  Patient: Paula Richard      Date of  Birth: 03-20-1951 MRN: [id number] PCP: Velna Hatchet, MD      Visit Date: 05/13/2020   Universal Protocol:    Date/Time: 05/13/2020  Consent Given By: the patient  Position: PRONE  Additional Comments: Vital signs were monitored before and after the procedure. Patient was prepped and draped in the usual sterile fashion. The correct patient, procedure, and site was verified.   Injection Procedure Details:  Procedure Site One Meds Administered:  Meds ordered this encounter  Medications  . methylPREDNISolone acetate (DEPO-MEDROL) injection 80 mg    Laterality: Right  Location/Site:  L5-S1  Needle size: 22 G  Needle type: Spinal  Needle Placement: Transforaminal  Findings:    -Comments: Excellent flow of contrast along the nerve, nerve root and into the epidural space.  Procedure Details: After squaring off the end-plates to get a true AP view, the C-arm was positioned so that an oblique view of the foramen as noted above was visualized. The target area is just inferior to the "nose of the scotty dog" or sub pedicular. The soft tissues overlying this structure were infiltrated with 2-3 ml. of 1% Lidocaine without Epinephrine.  The spinal needle was inserted toward the target using a "trajectory" view along the fluoroscope beam.  Under AP and lateral visualization, the needle was advanced so it did not puncture dura and was located close the 6 O'Clock position of the pedical in AP tracterory. Biplanar projections were used to confirm position. Aspiration was confirmed to be negative for CSF and/or blood. A 1-2 ml. volume of Isovue-250 was injected and  flow of contrast was noted at each level. Radiographs were obtained for documentation purposes.   After attaining the desired flow of contrast documented above, a 0.5 to 1.0 ml test dose of 0.25% Marcaine was injected into each respective transforaminal space.  The patient was observed for 90 seconds post injection.  After no  sensory deficits were reported, and normal lower extremity motor function was noted,   the above injectate was administered so that equal amounts of the injectate were placed at each foramen (level) into the transforaminal epidural space.   Additional Comments:  The patient tolerated the procedure well Dressing: 2 x 2 sterile gauze and Band-Aid    Post-procedure details: Patient was observed during the procedure. Post-procedure instructions were reviewed.  Patient left the clinic in stable condition.     Clinical History: MRI LUMBAR SPINE WITHOUT CONTRAST  TECHNIQUE: Multiplanar, multisequence MR imaging of the lumbar spine was performed. No intravenous contrast was administered.  COMPARISON:  None.  FINDINGS: Segmentation:  Standard.  Alignment:  Physiologic.  Vertebrae:  No fracture, evidence of discitis, or bone lesion.  Conus medullaris and cauda equina: Conus extends to the T12 level. Conus and cauda equina appear normal.  Paraspinal and other soft tissues: No acute paraspinal abnormality.  Disc levels:  Disc spaces: Degenerative disease with disc height loss at L4-5 and L5-S1  T12-L1: No significant disc bulge. No evidence of neural foraminal stenosis. No central canal stenosis.  L1-L2: Mild broad-based disc bulge. Mild bilateral facet arthropathy. No evidence of neural foraminal stenosis. No central canal stenosis.  L2-L3: Mild broad-based disc bulge. Mild bilateral facet arthropathy. No evidence of neural foraminal stenosis. No central canal stenosis.  L3-L4: Broad-based disc bulge. Moderate bilateral facet arthropathy with a left facet effusion. 9 mm left facet intraspinal synovial cyst. Mild spinal stenosis. No evidence of neural foraminal stenosis.  L4-L5: Broad-based disc osteophyte complex with a small broad central disc protrusion. Mild bilateral facet arthropathy. Bilateral subarticular recess stenosis. Mild spinal stenosis.  Mild bilateral foraminal narrowing.  L5-S1: Broad-based disc bulge with a right paracentral disc protrusion which contacts and posteriorly displaces the right S1 nerve root. Mild bilateral foraminal narrowing. No central canal stenosis.  IMPRESSION: 1. At L5-S1 there is a broad-based disc bulge with a right paracentral disc protrusion which contacts and posteriorly displaces the right S1 nerve root. Mild bilateral foraminal narrowing. 2. At L4-5 there is a broad-based disc osteophyte complex with a small broad central disc protrusion. Mild bilateral facet arthropathy. Bilateral subarticular recess stenosis. Mild spinal stenosis. Mild bilateral foraminal narrowing. 3. Lumbar spine spondylosis as described above.   Electronically Signed   By: Kathreen Devoid   On: 02/20/2020 11:09     Objective:  VS:  HT:    WT:   BMI:     BP:(!) 142/71  HR:(!) 59bpm  TEMP: ( )  RESP:  Physical Exam Constitutional:      General: She is not in acute distress.    Appearance: Normal appearance. She is not ill-appearing.  HENT:     Head: Normocephalic and atraumatic.     Right Ear: External ear normal.     Left Ear: External ear normal.  Eyes:     Extraocular Movements: Extraocular movements intact.  Cardiovascular:     Rate and Rhythm: Normal rate.     Pulses: Normal pulses.  Musculoskeletal:     Right lower leg: No edema.     Left lower leg: No edema.     Comments: Patient  has good distal strength with no pain over the greater trochanters.  No clonus or focal weakness.  Skin:    Findings: No erythema, lesion or rash.  Neurological:     General: No focal deficit present.     Mental Status: She is alert and oriented to person, place, and time.     Sensory: No sensory deficit.     Motor: No weakness or abnormal muscle tone.     Coordination: Coordination normal.  Psychiatric:        Mood and Affect: Mood normal.        Behavior: Behavior normal.      Imaging: XR C-ARM NO  REPORT  Result Date: 05/13/2020 Please see Notes tab for imaging impression.

## 2020-05-18 ENCOUNTER — Telehealth: Payer: Self-pay | Admitting: Orthopaedic Surgery

## 2020-05-18 MED ORDER — ACETAMINOPHEN-CODEINE #3 300-30 MG PO TABS: 1.0000 | ORAL_TABLET | Freq: Two times a day (BID) | ORAL | 0 refills | Status: AC | PRN

## 2020-05-18 NOTE — Telephone Encounter (Signed)
done

## 2020-05-18 NOTE — Telephone Encounter (Signed)
Pt aware.

## 2020-05-18 NOTE — Telephone Encounter (Signed)
Pt called stating she got a injection last week and it hasn't helped. Pt would like Tylenol 3 called in and she would like for the rx to say twice daily; pt wants a CB when this has been called in.  [phone]

## 2020-05-21 ENCOUNTER — Telehealth: Payer: Self-pay | Admitting: Orthopaedic Surgery

## 2020-05-21 DIAGNOSIS — G8929 Other chronic pain: Secondary | ICD-10-CM

## 2020-05-21 DIAGNOSIS — M545 Low back pain, unspecified: Secondary | ICD-10-CM

## 2020-05-21 NOTE — Telephone Encounter (Signed)
Yes nundkumar please.

## 2020-05-21 NOTE — Telephone Encounter (Signed)
Patient called stating that she is hurting pretty bad and would like a referral to a neurosurgeon.  CB#[phone].  Thank you.

## 2020-05-21 NOTE — Telephone Encounter (Signed)
Ok for this? 

## 2020-05-21 NOTE — Telephone Encounter (Signed)
IC sw patient and advised referral put in system and someone would reach out to her once referral complete

## 2020-06-01 ENCOUNTER — Telehealth: Payer: Self-pay | Admitting: *Deleted

## 2020-06-01 MED ORDER — ACETAMINOPHEN-CODEINE #3 300-30 MG PO TABS: 1.0000 | ORAL_TABLET | Freq: Every day | ORAL | 0 refills | Status: AC | PRN

## 2020-06-01 NOTE — Telephone Encounter (Signed)
Pt called to check on her referral to Dr. Kathyrn Sheriff and I advised her it was sent over to [facility] office and is in review, pt states she is still in terrible pain and would like to have pain meds until she can get appt with Nundkumar, she has been taking tylenol 3 and took 2 last night and somewhat better but is almost out and wants to see if can get something to help.

## 2020-06-01 NOTE — Telephone Encounter (Signed)
Please advise 

## 2020-06-25 DIAGNOSIS — M48061 Spinal stenosis, lumbar region without neurogenic claudication: Secondary | ICD-10-CM | POA: Diagnosis not present

## 2020-06-25 DIAGNOSIS — M5127 Other intervertebral disc displacement, lumbosacral region: Secondary | ICD-10-CM | POA: Diagnosis not present

## 2020-08-30 DIAGNOSIS — K219 Gastro-esophageal reflux disease without esophagitis: Secondary | ICD-10-CM | POA: Diagnosis not present

## 2020-08-30 DIAGNOSIS — Z0000 Encounter for general adult medical examination without abnormal findings: Secondary | ICD-10-CM | POA: Diagnosis not present

## 2020-08-30 DIAGNOSIS — Z794 Long term (current) use of insulin: Secondary | ICD-10-CM | POA: Diagnosis not present

## 2020-08-30 DIAGNOSIS — E785 Hyperlipidemia, unspecified: Secondary | ICD-10-CM | POA: Diagnosis not present

## 2020-08-30 DIAGNOSIS — R82998 Other abnormal findings in urine: Secondary | ICD-10-CM | POA: Diagnosis not present

## 2020-08-30 DIAGNOSIS — F419 Anxiety disorder, unspecified: Secondary | ICD-10-CM | POA: Diagnosis not present

## 2020-08-30 DIAGNOSIS — E1140 Type 2 diabetes mellitus with diabetic neuropathy, unspecified: Secondary | ICD-10-CM | POA: Diagnosis not present

## 2020-08-30 DIAGNOSIS — M859 Disorder of bone density and structure, unspecified: Secondary | ICD-10-CM | POA: Diagnosis not present

## 2020-08-30 DIAGNOSIS — Z23 Encounter for immunization: Secondary | ICD-10-CM | POA: Diagnosis not present

## 2020-08-30 DIAGNOSIS — E559 Vitamin D deficiency, unspecified: Secondary | ICD-10-CM | POA: Diagnosis not present

## 2020-08-30 DIAGNOSIS — I129 Hypertensive chronic kidney disease with stage 1 through stage 4 chronic kidney disease, or unspecified chronic kidney disease: Secondary | ICD-10-CM | POA: Diagnosis not present

## 2020-08-30 DIAGNOSIS — M797 Fibromyalgia: Secondary | ICD-10-CM | POA: Diagnosis not present

## 2020-09-03 DIAGNOSIS — E119 Type 2 diabetes mellitus without complications: Secondary | ICD-10-CM | POA: Diagnosis not present

## 2020-10-01 DIAGNOSIS — J069 Acute upper respiratory infection, unspecified: Secondary | ICD-10-CM | POA: Diagnosis not present

## 2020-10-01 DIAGNOSIS — J029 Acute pharyngitis, unspecified: Secondary | ICD-10-CM | POA: Diagnosis not present

## 2020-10-01 DIAGNOSIS — Z1152 Encounter for screening for COVID-19: Secondary | ICD-10-CM | POA: Diagnosis not present

## 2020-10-01 DIAGNOSIS — E1140 Type 2 diabetes mellitus with diabetic neuropathy, unspecified: Secondary | ICD-10-CM | POA: Diagnosis not present

## 2020-10-01 DIAGNOSIS — I10 Essential (primary) hypertension: Secondary | ICD-10-CM | POA: Diagnosis not present

## 2020-10-04 DIAGNOSIS — E875 Hyperkalemia: Secondary | ICD-10-CM | POA: Diagnosis not present

## 2020-10-04 DIAGNOSIS — J069 Acute upper respiratory infection, unspecified: Secondary | ICD-10-CM | POA: Diagnosis not present

## 2020-10-04 DIAGNOSIS — E1140 Type 2 diabetes mellitus with diabetic neuropathy, unspecified: Secondary | ICD-10-CM | POA: Diagnosis not present

## 2020-10-04 DIAGNOSIS — R1010 Upper abdominal pain, unspecified: Secondary | ICD-10-CM | POA: Diagnosis not present

## 2020-10-04 DIAGNOSIS — K219 Gastro-esophageal reflux disease without esophagitis: Secondary | ICD-10-CM | POA: Diagnosis not present

## 2020-10-11 ENCOUNTER — Other Ambulatory Visit: Payer: Self-pay | Admitting: Internal Medicine

## 2020-10-11 DIAGNOSIS — R1084 Generalized abdominal pain: Secondary | ICD-10-CM

## 2020-10-12 DIAGNOSIS — Z23 Encounter for immunization: Secondary | ICD-10-CM | POA: Diagnosis not present

## 2020-10-27 ENCOUNTER — Ambulatory Visit
Admission: RE | Admit: 2020-10-27 | Discharge: 2020-10-27 | Disposition: A | Discharge status: Home / Self Care | Payer: Medicare Other | Source: Ambulatory Visit | Attending: Internal Medicine | Admitting: Internal Medicine

## 2020-10-27 DIAGNOSIS — R1013 Epigastric pain: Secondary | ICD-10-CM | POA: Diagnosis not present

## 2020-10-27 DIAGNOSIS — R1084 Generalized abdominal pain: Secondary | ICD-10-CM

## 2020-12-17 DIAGNOSIS — Z1212 Encounter for screening for malignant neoplasm of rectum: Secondary | ICD-10-CM | POA: Diagnosis not present

## 2020-12-17 DIAGNOSIS — Z1211 Encounter for screening for malignant neoplasm of colon: Secondary | ICD-10-CM | POA: Diagnosis not present

## 2020-12-21 DIAGNOSIS — K219 Gastro-esophageal reflux disease without esophagitis: Secondary | ICD-10-CM | POA: Diagnosis not present

## 2020-12-21 DIAGNOSIS — E1169 Type 2 diabetes mellitus with other specified complication: Secondary | ICD-10-CM | POA: Diagnosis not present

## 2020-12-21 DIAGNOSIS — I10 Essential (primary) hypertension: Secondary | ICD-10-CM | POA: Diagnosis not present

## 2020-12-21 DIAGNOSIS — Z794 Long term (current) use of insulin: Secondary | ICD-10-CM | POA: Diagnosis not present

## 2020-12-25 LAB — COLOGUARD: COLOGUARD: NEGATIVE

## 2020-12-25 LAB — EXTERNAL GENERIC LAB PROCEDURE: COLOGUARD: NEGATIVE

## 2021-03-22 DIAGNOSIS — J069 Acute upper respiratory infection, unspecified: Secondary | ICD-10-CM | POA: Diagnosis not present

## 2021-03-22 DIAGNOSIS — R058 Other specified cough: Secondary | ICD-10-CM | POA: Diagnosis not present

## 2021-03-22 DIAGNOSIS — Z1152 Encounter for screening for COVID-19: Secondary | ICD-10-CM | POA: Diagnosis not present

## 2021-04-20 DIAGNOSIS — I10 Essential (primary) hypertension: Secondary | ICD-10-CM | POA: Diagnosis not present

## 2021-04-20 DIAGNOSIS — E785 Hyperlipidemia, unspecified: Secondary | ICD-10-CM | POA: Diagnosis not present

## 2021-04-20 DIAGNOSIS — E1169 Type 2 diabetes mellitus with other specified complication: Secondary | ICD-10-CM | POA: Diagnosis not present

## 2021-04-20 DIAGNOSIS — Z794 Long term (current) use of insulin: Secondary | ICD-10-CM | POA: Diagnosis not present

## 2021-04-29 DIAGNOSIS — L237 Allergic contact dermatitis due to plants, except food: Secondary | ICD-10-CM | POA: Diagnosis not present

## 2021-05-02 DIAGNOSIS — N393 Stress incontinence (female) (male): Secondary | ICD-10-CM | POA: Diagnosis not present

## 2021-05-02 DIAGNOSIS — E1140 Type 2 diabetes mellitus with diabetic neuropathy, unspecified: Secondary | ICD-10-CM | POA: Diagnosis not present

## 2021-05-02 DIAGNOSIS — L237 Allergic contact dermatitis due to plants, except food: Secondary | ICD-10-CM | POA: Diagnosis not present

## 2021-07-27 DIAGNOSIS — I10 Essential (primary) hypertension: Secondary | ICD-10-CM | POA: Diagnosis not present

## 2021-07-27 DIAGNOSIS — Z794 Long term (current) use of insulin: Secondary | ICD-10-CM | POA: Diagnosis not present

## 2021-07-27 DIAGNOSIS — E785 Hyperlipidemia, unspecified: Secondary | ICD-10-CM | POA: Diagnosis not present

## 2021-07-27 DIAGNOSIS — E1169 Type 2 diabetes mellitus with other specified complication: Secondary | ICD-10-CM | POA: Diagnosis not present

## 2021-08-15 DIAGNOSIS — Z1211 Encounter for screening for malignant neoplasm of colon: Secondary | ICD-10-CM | POA: Diagnosis not present

## 2021-08-15 DIAGNOSIS — K648 Other hemorrhoids: Secondary | ICD-10-CM | POA: Diagnosis not present

## 2021-09-05 DIAGNOSIS — E785 Hyperlipidemia, unspecified: Secondary | ICD-10-CM | POA: Diagnosis not present

## 2021-09-05 DIAGNOSIS — I10 Essential (primary) hypertension: Secondary | ICD-10-CM | POA: Diagnosis not present

## 2021-09-05 DIAGNOSIS — E559 Vitamin D deficiency, unspecified: Secondary | ICD-10-CM | POA: Diagnosis not present

## 2021-09-05 DIAGNOSIS — E1169 Type 2 diabetes mellitus with other specified complication: Secondary | ICD-10-CM | POA: Diagnosis not present

## 2021-09-12 DIAGNOSIS — Z1231 Encounter for screening mammogram for malignant neoplasm of breast: Secondary | ICD-10-CM | POA: Diagnosis not present

## 2021-09-19 DIAGNOSIS — K648 Other hemorrhoids: Secondary | ICD-10-CM | POA: Diagnosis not present

## 2021-10-13 DIAGNOSIS — E119 Type 2 diabetes mellitus without complications: Secondary | ICD-10-CM | POA: Diagnosis not present

## 2021-11-08 DIAGNOSIS — M8589 Other specified disorders of bone density and structure, multiple sites: Secondary | ICD-10-CM | POA: Diagnosis not present

## 2021-11-15 DIAGNOSIS — S0502XA Injury of conjunctiva and corneal abrasion without foreign body, left eye, initial encounter: Secondary | ICD-10-CM | POA: Diagnosis not present

## 2021-11-23 DIAGNOSIS — S0502XD Injury of conjunctiva and corneal abrasion without foreign body, left eye, subsequent encounter: Secondary | ICD-10-CM | POA: Diagnosis not present

## 2021-11-25 DIAGNOSIS — H5203 Hypermetropia, bilateral: Secondary | ICD-10-CM | POA: Diagnosis not present

## 2022-04-05 DIAGNOSIS — I10 Essential (primary) hypertension: Secondary | ICD-10-CM | POA: Diagnosis not present

## 2022-04-05 DIAGNOSIS — Z794 Long term (current) use of insulin: Secondary | ICD-10-CM | POA: Diagnosis not present

## 2022-04-05 DIAGNOSIS — E785 Hyperlipidemia, unspecified: Secondary | ICD-10-CM | POA: Diagnosis not present

## 2022-04-05 DIAGNOSIS — E1169 Type 2 diabetes mellitus with other specified complication: Secondary | ICD-10-CM | POA: Diagnosis not present

## 2022-07-11 DIAGNOSIS — Z794 Long term (current) use of insulin: Secondary | ICD-10-CM | POA: Diagnosis not present

## 2022-07-11 DIAGNOSIS — I129 Hypertensive chronic kidney disease with stage 1 through stage 4 chronic kidney disease, or unspecified chronic kidney disease: Secondary | ICD-10-CM | POA: Diagnosis not present

## 2022-07-11 DIAGNOSIS — M797 Fibromyalgia: Secondary | ICD-10-CM | POA: Diagnosis not present

## 2022-07-11 DIAGNOSIS — E1169 Type 2 diabetes mellitus with other specified complication: Secondary | ICD-10-CM | POA: Diagnosis not present

## 2022-07-17 DIAGNOSIS — Z1211 Encounter for screening for malignant neoplasm of colon: Secondary | ICD-10-CM | POA: Diagnosis not present

## 2022-07-17 DIAGNOSIS — K649 Unspecified hemorrhoids: Secondary | ICD-10-CM | POA: Diagnosis not present

## 2022-07-17 DIAGNOSIS — K5730 Diverticulosis of large intestine without perforation or abscess without bleeding: Secondary | ICD-10-CM | POA: Diagnosis not present

## 2022-07-25 ENCOUNTER — Ambulatory Visit: Payer: Medicare Other | Admitting: Orthopaedic Surgery

## 2022-07-25 ENCOUNTER — Encounter: Payer: Self-pay | Admitting: Orthopaedic Surgery

## 2022-07-25 DIAGNOSIS — M65841 Other synovitis and tenosynovitis, right hand: Secondary | ICD-10-CM | POA: Insufficient documentation

## 2022-07-25 DIAGNOSIS — Z794 Long term (current) use of insulin: Secondary | ICD-10-CM

## 2022-07-25 DIAGNOSIS — E119 Type 2 diabetes mellitus without complications: Secondary | ICD-10-CM

## 2022-07-25 DIAGNOSIS — M65331 Trigger finger, right middle finger: Secondary | ICD-10-CM

## 2022-07-25 NOTE — Addendum Note (Signed)
Addended by: Lendon Collar on: 07/25/2022 10:21 AM   Modules accepted: Orders

## 2022-07-25 NOTE — Progress Notes (Signed)
Office Visit Note   Patient: Paula Richard           Date of Birth: 1950-12-20           MRN: [id number] Visit Date: 07/25/2022              Requested by: Velna Hatchet, MD [address] [place],  [place] [address] PCP: Velna Hatchet, MD   Assessment & Plan: Visit Diagnoses:  1. Trigger finger, right middle finger   2. Stenosing tenosynovitis of finger of right hand     Plan: Impression is stenosing tenosynovitis of the right long finger.  Treatment options were reviewed and based on the fact that cortisone injections have not been effective for other fingers in the past she is leaning towards surgical treatment.  Given that her diabetes is not under great control would like to recheck an A1c today to determine if surgery is an option at this time.  We will follow-up with the patient with the results.  Follow-Up Instructions: No follow-ups on file.   Orders:  No orders of the defined types were placed in this encounter.  No orders of the defined types were placed in this encounter.     Procedures: No procedures performed   Clinical Data: No additional findings.   Subjective: Chief Complaint  Patient presents with   Right Hand - Pain    HPI Patient is a 71 year old female who is well-known to me comes in for 6 months of right long finger pain.  She is undergone a couple trigger finger releases in the past on other fingers.  She feels swelling in the palm.  Denies any triggering.  Last A1c a month ago was in the nines.  Review of Systems  Constitutional: Negative.   HENT: Negative.    Eyes: Negative.   Respiratory: Negative.    Cardiovascular: Negative.   Endocrine: Negative.   Musculoskeletal: Negative.   Neurological: Negative.   Hematological: Negative.   Psychiatric/Behavioral: Negative.    All other systems reviewed and are negative.    Objective: Vital Signs: There were no vitals taken for this visit.  Physical Exam Vitals and nursing  note reviewed.  Constitutional:      Appearance: She is well-developed.  HENT:     Head: Atraumatic.     Nose: Nose normal.  Eyes:     Extraocular Movements: Extraocular movements intact.  Cardiovascular:     Pulses: Normal pulses.  Pulmonary:     Effort: Pulmonary effort is normal.  Abdominal:     Palpations: Abdomen is soft.  Musculoskeletal:     Cervical back: Neck supple.  Skin:    General: Skin is warm.     Capillary Refill: Capillary refill takes less than 2 seconds.  Neurological:     Mental Status: She is alert. Mental status is at baseline.  Psychiatric:        Behavior: Behavior normal.        Thought Content: Thought content normal.        Judgment: Judgment normal.     Ortho Exam Emanation of right hand shows tender nodule in the palm in line with the long finger and A1 pulley.  No active triggering.  Arc of motion is normal. Specialty Comments:  No specialty comments available.  Imaging: No results found.   PMFS History: Patient Active Problem List   Diagnosis Date Noted   Trigger finger, right middle finger 07/25/2022   Stenosing tenosynovitis of finger of right hand  07/25/2022   Extensor tenosynovitis of left wrist 03/05/2019   Trigger finger, left middle finger 03/05/2019   Hamstring tendinitis of right thigh 03/05/2019   Unilateral primary osteoarthritis, left knee 05/08/2017   Trigger finger, left ring finger 05/08/2017   Trigger finger, right ring finger 10/23/2016   Other chest pain 04/14/2015   Type 2 diabetes mellitus without complication ([place]) 74/94/4967   Hyperlipidemia 04/14/2015   Essential hypertension 04/14/2015   Epigastric pain 04/14/2015   Palpitations 04/14/2015   Past Medical History:  Diagnosis Date   Anxiety    Arthritis    back, knees   Cyst of buttocks 02/2018   right   Dental crowns present    FSGS (focal segmental glomerulosclerosis)    states currently in remission   GERD (gastroesophageal reflux disease)     History of Bell's palsy    History of shingles    Hypertension    states under control with med., has been on med. x 30 yr.   Insulin dependent diabetes mellitus    Neuropathy    feet   Seasonal allergies     No family history on file.  Past Surgical History:  Procedure Laterality Date   ABDOMINAL HYSTERECTOMY     complete   CARPAL TUNNEL RELEASE Bilateral    FOOT SURGERY Bilateral    MASS EXCISION Right 03/29/2018   Procedure: EXCISION OF RIGHT BUTTOCK CYST;  Surgeon: Johnathan Hausen, MD;  Location: [facility];  Service: General;  Laterality: Right;   NASAL SEPTUM SURGERY     STERIOD INJECTION Left 05/23/2017   Procedure: LEFT TRIGGER FINGER INJECTION;  Surgeon: Leandrew Koyanagi, MD;  Location: [facility];  Service: Orthopedics;  Laterality: Left;   TONSILLECTOMY     TRIGGER FINGER RELEASE Right 05/23/2017   Procedure: RIGHT RING FINGER RELEASE TRIGGER FINGER AND TENOLYSIS, LEFT RING FINGER TRIGGER FINGER INJECTION;  Surgeon: Leandrew Koyanagi, MD;  Location: [facility];  Service: Orthopedics;  Laterality: Right;   TRIGGER FINGER RELEASE Left 12/26/2017   Procedure: RELEASE TRIGGER FINGER LEFT RING FINGER;  Surgeon: Leandrew Koyanagi, MD;  Location: [facility] [facility];  Service: Orthopedics;  Laterality: Left;   TRIGGER FINGER RELEASE Left 06/11/2019   Procedure: RELEASE TRIGGER FINGER LEFT MIDDLE FINGER;  Surgeon: Leandrew Koyanagi, MD;  Location: [facility];  Service: Orthopedics;  Laterality: Left;   Social History   Occupational History   Not on file  Tobacco Use   Smoking status: Never   Smokeless tobacco: Never  Vaping Use   Vaping Use: Never used  Substance and Sexual Activity   Alcohol use: No   Drug use: No   Sexual activity: Not on file

## 2022-07-26 LAB — HEMOGLOBIN A1C
Hgb A1c MFr Bld: 9.5 % of total Hgb — ABNORMAL HIGH (ref <5.7)
Mean Plasma Glucose: 226 mg/dL
eAG (mmol/L): 12.5 mmol/L

## 2022-09-22 DIAGNOSIS — Z1231 Encounter for screening mammogram for malignant neoplasm of breast: Secondary | ICD-10-CM | POA: Diagnosis not present

## 2022-10-02 DIAGNOSIS — J0100 Acute maxillary sinusitis, unspecified: Secondary | ICD-10-CM | POA: Diagnosis not present

## 2022-10-02 DIAGNOSIS — R5383 Other fatigue: Secondary | ICD-10-CM | POA: Diagnosis not present

## 2022-10-02 DIAGNOSIS — E1169 Type 2 diabetes mellitus with other specified complication: Secondary | ICD-10-CM | POA: Diagnosis not present

## 2022-10-02 DIAGNOSIS — R051 Acute cough: Secondary | ICD-10-CM | POA: Diagnosis not present

## 2022-12-06 DIAGNOSIS — E559 Vitamin D deficiency, unspecified: Secondary | ICD-10-CM | POA: Diagnosis not present

## 2022-12-06 DIAGNOSIS — E1140 Type 2 diabetes mellitus with diabetic neuropathy, unspecified: Secondary | ICD-10-CM | POA: Diagnosis not present

## 2022-12-06 DIAGNOSIS — E785 Hyperlipidemia, unspecified: Secondary | ICD-10-CM | POA: Diagnosis not present

## 2022-12-13 DIAGNOSIS — Z1331 Encounter for screening for depression: Secondary | ICD-10-CM | POA: Diagnosis not present

## 2022-12-13 DIAGNOSIS — Z1339 Encounter for screening examination for other mental health and behavioral disorders: Secondary | ICD-10-CM | POA: Diagnosis not present

## 2022-12-13 DIAGNOSIS — Z0000 Encounter for general adult medical examination without abnormal findings: Secondary | ICD-10-CM | POA: Diagnosis not present

## 2022-12-13 DIAGNOSIS — I10 Essential (primary) hypertension: Secondary | ICD-10-CM | POA: Diagnosis not present

## 2022-12-13 DIAGNOSIS — Z23 Encounter for immunization: Secondary | ICD-10-CM | POA: Diagnosis not present

## 2022-12-13 DIAGNOSIS — E1140 Type 2 diabetes mellitus with diabetic neuropathy, unspecified: Secondary | ICD-10-CM | POA: Diagnosis not present

## 2022-12-27 DIAGNOSIS — E119 Type 2 diabetes mellitus without complications: Secondary | ICD-10-CM | POA: Diagnosis not present

## 2023-02-14 DIAGNOSIS — H04123 Dry eye syndrome of bilateral lacrimal glands: Secondary | ICD-10-CM | POA: Diagnosis not present

## 2023-04-18 DIAGNOSIS — Z794 Long term (current) use of insulin: Secondary | ICD-10-CM | POA: Diagnosis not present

## 2023-04-18 DIAGNOSIS — E785 Hyperlipidemia, unspecified: Secondary | ICD-10-CM | POA: Diagnosis not present

## 2023-04-18 DIAGNOSIS — E1140 Type 2 diabetes mellitus with diabetic neuropathy, unspecified: Secondary | ICD-10-CM | POA: Diagnosis not present

## 2023-04-18 DIAGNOSIS — I10 Essential (primary) hypertension: Secondary | ICD-10-CM | POA: Diagnosis not present

## 2023-05-02 DIAGNOSIS — M65331 Trigger finger, right middle finger: Secondary | ICD-10-CM | POA: Diagnosis not present

## 2023-05-02 DIAGNOSIS — G5603 Carpal tunnel syndrome, bilateral upper limbs: Secondary | ICD-10-CM | POA: Diagnosis not present

## 2023-06-28 DIAGNOSIS — G5603 Carpal tunnel syndrome, bilateral upper limbs: Secondary | ICD-10-CM | POA: Diagnosis not present

## 2023-09-18 DIAGNOSIS — H5203 Hypermetropia, bilateral: Secondary | ICD-10-CM | POA: Diagnosis not present

## 2023-10-09 DIAGNOSIS — M65331 Trigger finger, right middle finger: Secondary | ICD-10-CM | POA: Diagnosis not present

## 2023-10-09 DIAGNOSIS — Z1231 Encounter for screening mammogram for malignant neoplasm of breast: Secondary | ICD-10-CM | POA: Diagnosis not present

## 2023-10-09 DIAGNOSIS — G5603 Carpal tunnel syndrome, bilateral upper limbs: Secondary | ICD-10-CM | POA: Diagnosis not present

## 2023-10-28 DIAGNOSIS — E119 Type 2 diabetes mellitus without complications: Secondary | ICD-10-CM | POA: Diagnosis not present

## 2023-11-02 DIAGNOSIS — G5601 Carpal tunnel syndrome, right upper limb: Secondary | ICD-10-CM | POA: Diagnosis not present

## 2023-11-02 DIAGNOSIS — M65331 Trigger finger, right middle finger: Secondary | ICD-10-CM | POA: Diagnosis not present

## 2023-11-07 DIAGNOSIS — M79641 Pain in right hand: Secondary | ICD-10-CM | POA: Diagnosis not present

## 2023-11-28 DIAGNOSIS — E119 Type 2 diabetes mellitus without complications: Secondary | ICD-10-CM | POA: Diagnosis not present

## 2023-12-12 DIAGNOSIS — E785 Hyperlipidemia, unspecified: Secondary | ICD-10-CM | POA: Diagnosis not present

## 2023-12-12 DIAGNOSIS — I10 Essential (primary) hypertension: Secondary | ICD-10-CM | POA: Diagnosis not present

## 2023-12-12 DIAGNOSIS — E1169 Type 2 diabetes mellitus with other specified complication: Secondary | ICD-10-CM | POA: Diagnosis not present

## 2023-12-12 DIAGNOSIS — E559 Vitamin D deficiency, unspecified: Secondary | ICD-10-CM | POA: Diagnosis not present

## 2023-12-19 DIAGNOSIS — Z1331 Encounter for screening for depression: Secondary | ICD-10-CM | POA: Diagnosis not present

## 2023-12-19 DIAGNOSIS — Z0000 Encounter for general adult medical examination without abnormal findings: Secondary | ICD-10-CM | POA: Diagnosis not present

## 2023-12-19 DIAGNOSIS — Z1339 Encounter for screening examination for other mental health and behavioral disorders: Secondary | ICD-10-CM | POA: Diagnosis not present

## 2023-12-19 DIAGNOSIS — E1121 Type 2 diabetes mellitus with diabetic nephropathy: Secondary | ICD-10-CM | POA: Diagnosis not present

## 2023-12-29 DIAGNOSIS — E119 Type 2 diabetes mellitus without complications: Secondary | ICD-10-CM | POA: Diagnosis not present

## 2024-01-26 DIAGNOSIS — E119 Type 2 diabetes mellitus without complications: Secondary | ICD-10-CM | POA: Diagnosis not present

## 2024-02-22 DIAGNOSIS — H524 Presbyopia: Secondary | ICD-10-CM | POA: Diagnosis not present

## 2024-02-22 DIAGNOSIS — H16223 Keratoconjunctivitis sicca, not specified as Sjogren's, bilateral: Secondary | ICD-10-CM | POA: Diagnosis not present

## 2024-02-22 DIAGNOSIS — H5203 Hypermetropia, bilateral: Secondary | ICD-10-CM | POA: Diagnosis not present

## 2024-02-22 DIAGNOSIS — E119 Type 2 diabetes mellitus without complications: Secondary | ICD-10-CM | POA: Diagnosis not present

## 2024-02-22 DIAGNOSIS — Z794 Long term (current) use of insulin: Secondary | ICD-10-CM | POA: Diagnosis not present

## 2024-02-26 DIAGNOSIS — E119 Type 2 diabetes mellitus without complications: Secondary | ICD-10-CM | POA: Diagnosis not present

## 2024-03-27 DIAGNOSIS — E119 Type 2 diabetes mellitus without complications: Secondary | ICD-10-CM | POA: Diagnosis not present

## 2024-03-31 DIAGNOSIS — Z23 Encounter for immunization: Secondary | ICD-10-CM | POA: Diagnosis not present

## 2024-03-31 DIAGNOSIS — E1121 Type 2 diabetes mellitus with diabetic nephropathy: Secondary | ICD-10-CM | POA: Diagnosis not present

## 2024-03-31 DIAGNOSIS — I10 Essential (primary) hypertension: Secondary | ICD-10-CM | POA: Diagnosis not present

## 2024-04-27 DIAGNOSIS — E119 Type 2 diabetes mellitus without complications: Secondary | ICD-10-CM | POA: Diagnosis not present

## 2024-05-27 DIAGNOSIS — E119 Type 2 diabetes mellitus without complications: Secondary | ICD-10-CM | POA: Diagnosis not present

## 2024-06-10 DIAGNOSIS — L259 Unspecified contact dermatitis, unspecified cause: Secondary | ICD-10-CM | POA: Diagnosis not present

## 2024-06-10 DIAGNOSIS — R21 Rash and other nonspecific skin eruption: Secondary | ICD-10-CM | POA: Diagnosis not present

## 2024-06-27 DIAGNOSIS — E119 Type 2 diabetes mellitus without complications: Secondary | ICD-10-CM | POA: Diagnosis not present

## 2024-07-07 DIAGNOSIS — M5126 Other intervertebral disc displacement, lumbar region: Secondary | ICD-10-CM | POA: Diagnosis not present

## 2024-07-07 DIAGNOSIS — Z6826 Body mass index (BMI) 26.0-26.9, adult: Secondary | ICD-10-CM | POA: Diagnosis not present

## 2024-07-28 DIAGNOSIS — E119 Type 2 diabetes mellitus without complications: Secondary | ICD-10-CM | POA: Diagnosis not present

## 2024-08-07 DIAGNOSIS — Z23 Encounter for immunization: Secondary | ICD-10-CM | POA: Diagnosis not present

## 2024-08-07 DIAGNOSIS — E1121 Type 2 diabetes mellitus with diabetic nephropathy: Secondary | ICD-10-CM | POA: Diagnosis not present

## 2024-08-07 DIAGNOSIS — I10 Essential (primary) hypertension: Secondary | ICD-10-CM | POA: Diagnosis not present

## 2024-08-27 DIAGNOSIS — E119 Type 2 diabetes mellitus without complications: Secondary | ICD-10-CM | POA: Diagnosis not present

## 2024-08-28 ENCOUNTER — Ambulatory Visit: Payer: Self-pay | Admitting: Rehabilitative and Restorative Service Providers"

## 2024-09-10 ENCOUNTER — Encounter: Payer: Self-pay | Admitting: Rehabilitative and Restorative Service Providers"

## 2024-09-10 ENCOUNTER — Ambulatory Visit: Payer: Self-pay | Admitting: Rehabilitative and Restorative Service Providers"

## 2024-09-10 DIAGNOSIS — M6281 Muscle weakness (generalized): Secondary | ICD-10-CM | POA: Diagnosis not present

## 2024-09-10 DIAGNOSIS — M5459 Other low back pain: Secondary | ICD-10-CM

## 2024-09-10 DIAGNOSIS — R293 Abnormal posture: Secondary | ICD-10-CM

## 2024-09-10 NOTE — Therapy (Signed)
 OUTPATIENT PHYSICAL THERAPY THORACOLUMBAR EVALUATION   Patient Name: Paula Richard MRN: [id number] DOB:23-Dec-1950, 73 y.o., female Today's Date: 09/10/2024  END OF SESSION:  PT End of Session - 09/10/24 1718     Visit Number 1    Number of Visits 16    Date for Recertification  11/05/24    PT Start Time 1434    PT Stop Time 1516    PT Time Calculation (min) 42 min    Activity Tolerance Patient tolerated treatment well;No increased pain;Patient limited by pain    Behavior During Therapy [facility] for tasks assessed/performed          Past Medical History:  Diagnosis Date   Anxiety    Arthritis    back, knees   Cyst of buttocks 02/2018   right   Dental crowns present    FSGS (focal segmental glomerulosclerosis)    states currently in remission   GERD (gastroesophageal reflux disease)    History of Bell's palsy    History of shingles    Hypertension    states under control with med., has been on med. x 30 yr.   Insulin dependent diabetes mellitus    Neuropathy    feet   Seasonal allergies    Past Surgical History:  Procedure Laterality Date   ABDOMINAL HYSTERECTOMY     complete   CARPAL TUNNEL RELEASE Bilateral    FOOT SURGERY Bilateral    MASS EXCISION Right 03/29/2018   Procedure: EXCISION OF RIGHT BUTTOCK CYST;  Surgeon: Paula Cough, MD;  Location: [facility];  Service: General;  Laterality: Right;   NASAL SEPTUM SURGERY     STERIOD INJECTION Left 05/23/2017   Procedure: LEFT TRIGGER FINGER INJECTION;  Surgeon: Paula Kay CHRISTELLA, MD;  Location: [facility];  Service: Orthopedics;  Laterality: Left;   TONSILLECTOMY     TRIGGER FINGER RELEASE Right 05/23/2017   Procedure: RIGHT RING FINGER RELEASE TRIGGER FINGER AND TENOLYSIS, LEFT RING FINGER TRIGGER FINGER INJECTION;  Surgeon: Paula Kay CHRISTELLA, MD;  Location: [facility];  Service: Orthopedics;  Laterality: Right;   TRIGGER FINGER RELEASE Left 12/26/2017   Procedure: RELEASE  TRIGGER FINGER LEFT RING FINGER;  Surgeon: Paula Kay CHRISTELLA, MD;  Location: [facility];  Service: Orthopedics;  Laterality: Left;   TRIGGER FINGER RELEASE Left 06/11/2019   Procedure: RELEASE TRIGGER FINGER LEFT MIDDLE FINGER;  Surgeon: Paula Kay CHRISTELLA, MD;  Location: [facility];  Service: Orthopedics;  Laterality: Left;   Patient Active Problem List   Diagnosis Date Noted   Trigger finger, right middle finger 07/25/2022   Stenosing tenosynovitis of finger of right hand 07/25/2022   Extensor tenosynovitis of left wrist 03/05/2019   Trigger finger, left middle finger 03/05/2019   Hamstring tendinitis of right thigh 03/05/2019   Unilateral primary osteoarthritis, left knee 05/08/2017   Trigger finger, left ring finger 05/08/2017   Trigger finger, right ring finger 10/23/2016   Other chest pain 04/14/2015   Type 2 diabetes mellitus without complication 04/14/2015   Hyperlipidemia 04/14/2015   Essential hypertension 04/14/2015   Epigastric pain 04/14/2015   Palpitations 04/14/2015    PCP: Paula Freeman, MD  REFERRING PROVIDER: Gerldine Maizes, MD  REFERRING DIAG:  M51.26 (ICD-10-CM) - Other intervertebral disc displacement, lumbar region    Rationale for Evaluation and Treatment: Rehabilitation  THERAPY DIAG:  Abnormal posture - Plan: PT plan of care cert/re-cert  Muscle weakness (generalized) - Plan: PT plan of care cert/re-cert  Other low back pain - Plan: PT plan of care cert/re-cert  ONSET DATE: 4-5 months ago  SUBJECTIVE:                                                                                                                                                                                           SUBJECTIVE STATEMENT: Paula Richard had low back surgery in 2021 (laminectomy/discectomy).  She is now getting some back and gluteal pain, particularly when working in the yard and carrying things.  Symptoms have been worsening over the past 4-5  months.  PERTINENT HISTORY:  Anxiety, OA back and knees, peripheral neuropathy, Type 2 DM, HTN, trigger finger releases, previous lumbar surgery  PAIN:  Are you having pain? Yes: NPRS scale: 2-8/10 this week Pain location: Mostly right sided but can be bilateral Pain description: Ache, sore, spasm, can be sharp Aggravating factors: Carrying anything and yard work Relieving factors: Lying down  PRECAUTIONS: Back  RED FLAGS: None   WEIGHT BEARING RESTRICTIONS: No  FALLS:  Has patient fallen in last 6 months? Yes. Number of falls 2, will seek more details visit 2  LIVING ENVIRONMENT: Lives with: lives with their family and lives with their spouse Lives in: House/apartment Stairs: Has some trouble with stairs Has following equipment at home: None  OCCUPATION: Retired, doing everything around the house (husband has a disability)  PLOF: Independent  PATIENT GOALS: Be able to manage household chores without being limited by the back  NEXT MD VISIT: NA  OBJECTIVE:  Note: Objective measures were completed at Evaluation unless otherwise noted.  DIAGNOSTIC FINDINGS:  2021 Pre-surgical MRI: 1. At L5-S1 there is a broad-based disc bulge with a right paracentral disc protrusion which contacts and posteriorly displaces the right S1 nerve root. Mild bilateral foraminal narrowing. 2. At L4-5 there is a broad-based disc osteophyte complex with a small broad central disc protrusion. Mild bilateral facet arthropathy. Bilateral subarticular recess stenosis. Mild spinal stenosis. Mild bilateral foraminal narrowing. 3. Lumbar spine spondylosis as described above.  PATIENT SURVEYS:  PSFS: THE PATIENT SPECIFIC FUNCTIONAL SCALE  Place score of 0-10 (0 = unable to perform activity and 10 = able to perform activity at the same level as before injury or problem)  Activity Date: 09/10/2024    Paula Richard work 3    2.  Take care of outside animals 7    3.  Sitting 2    4.  Standing 2    Total  Score 3.5      Total Score = Sum of activity scores/number of activities  Minimally Detectable Change: 3 points (for single activity); 2 points (for average score)  [facility] (nd). The Patient Specific Functional Scale . Retrieved from [email or web address]   COGNITION: Overall cognitive status: Within functional limits for tasks assessed     SENSATION: Ashya notes no peripheral pain or paresthesias below the gluteal folds.  She has low back and at times gluteal pain.  MUSCLE LENGTH: Hamstrings: Right 45 deg; Left 45 deg  POSTURE: Good posture when standing and Aylen understands the importance of good posture.  However, her body mechanics as described with yard work and other activities around the house will need to improve.  LUMBAR ROM:   AROM 09/10/2024  Flexion   Extension 10  Right lateral flexion   Left lateral flexion   Right rotation   Left rotation    (Blank rows = not tested)  LOWER EXTREMITY ROM:     Passive  Left/Right 09/10/2024   Hip flexion 90/90   Hip extension    Hip abduction    Hip adduction    Hip internal rotation 3/14   Hip external rotation 38/36   Knee flexion    Knee extension    Ankle dorsiflexion    Ankle plantarflexion    Ankle inversion    Ankle eversion     (Blank rows = not tested)  STRENGTH:     Left/Right 09/10/2024   Hip flexion    Hip extension    Hip abduction    Hip adduction    Hip internal rotation    Hip external rotation    Knee flexion    Knee extension    Ankle dorsiflexion    Ankle plantarflexion    Ankle inversion    Ankle eversion    Lumbar extension (prone bilateral arm & leg extension test) 18 seconds (Goal is 30-60)    (Blank rows = not tested)  GAIT: Distance walked: 100 feet Assistive device utilized: None Level of assistance: Complete Independence Comments: Yina notes she used to walk for exercise but has not done so in  quite some time  TREATMENT DATE:  09/10/2024 Standing lumbar extension AROM 10 x 3 seconds (emphasis on hips forward and hands on gluteals to include the entire lumbar spine) Prone alternating hip extension 2 sets of 10 for 3 seconds     97535: Reviewed spine anatomy using the spine model; discussed her previous surgery which was likely a laminectomy and discectomy; discussed the importance of correct body mechanics with activities around the house including logroll, golfers and diagonal squat lifts; reviewed examination findings and her day 1 home exercise program                                                                                                                             PATIENT EDUCATION:  Education details: See above Person educated: Patient Education method: Explanation, Demonstration, Tactile cues, Verbal cues, and Handouts Education comprehension: verbalized understanding, returned demonstration, verbal cues required, tactile cues required, and needs further education  HOME EXERCISE PROGRAM: Access Code:  [email or web address] URL: [email or web address] Date: 09/10/2024 Prepared by: Lamar Ivory  Exercises - Standing Lumbar Extension at Wall - Forearms  - 5 x daily - 7 x weekly - 1 sets - 5 reps - 3 seconds hold - Prone Hip Extension  - 2 x daily - 7 x weekly - 2 sets - 10 reps - 3 seconds hold  ASSESSMENT:  CLINICAL IMPRESSION: Patient is a 73 y.o. female who was seen today for physical therapy evaluation and treatment for  M51.26 (ICD-10-CM) - Other intervertebral disc displacement, lumbar region  Armonie has a previous lumbar surgery which helped with severe sciatic symptoms at that time.  She notes she has had increased responsibilities around the home as her husband has developed a disability and she believes the increased workload and perhaps some poor body mechanics are contributing to her current low back pain.  Body mechanics and poor lumbar strength  appear to be the primary impairments and I started addressing both today with education and a starter home exercise program.  I anticipate Elvina should be able to meet the below listed goals within the recommended plan of care.  OBJECTIVE IMPAIRMENTS: decreased activity tolerance, decreased endurance, decreased knowledge of condition, decreased ROM, decreased strength, decreased safety awareness, improper body mechanics, postural dysfunction, and pain.   ACTIVITY LIMITATIONS: carrying, lifting, bending, sitting, standing, stairs, bed mobility, and locomotion level  PARTICIPATION LIMITATIONS: meal prep, cleaning, laundry, driving, shopping, community activity, and yard work  PERSONAL FACTORS: Anxiety, OA back and knees, peripheral neuropathy, Type 2 DM, HTN, trigger finger releases, previous lumbar surgery are also affecting patient's functional outcome.   REHAB POTENTIAL: Good  CLINICAL DECISION MAKING: Evolving/moderate complexity  EVALUATION COMPLEXITY: Moderate   GOALS: Goals reviewed with patient? Yes  SHORT TERM GOALS: Target date: 10/08/2024  Lilliauna will be independent with her day 1 home exercise program Baseline: Started 09/10/2024 Goal status: INITIAL  2.  Improve lumbar extension AROM to 15 degrees Baseline: 10 degrees Goal status: INITIAL  3.  Arline will have a better understanding of correct lifting techniques including golfers lift and diagonal squat lift and will be able to implement these into activities around her house and yard Baseline: Poor body mechanics mentioned and observed at evaluation Goal status: INITIAL   LONG TERM GOALS: Target date: 11/05/2024  Improve patient-specific functional score to at least 6.5 Baseline: 3.5 Goal status: INITIAL  2.  Deetra will report low back and bilateral gluteal pain no greater than 3/10 on the numeric pain rating scale Baseline: Can be 8/10 Goal status: INITIAL  3.  Joelie will be able to maintain the  spine strength test position for a minimum of 30 seconds Baseline: 18 seconds Goal status: INITIAL  4.  Callahan will be independent with her long-term maintenance home exercise program at discharge Baseline: Started 09/10/2024 Goal status: INITIAL  PLAN:  PT FREQUENCY: 1-2x/week  PT DURATION: 8 weeks  PLANNED INTERVENTIONS: 97750- Physical Performance Testing, 97110-Therapeutic exercises, 97530- Therapeutic activity, 97112- Neuromuscular re-education, 97535- Self Care, 02859- Manual therapy, [id number]- Electrical stimulation (unattended), 97012- Traction (mechanical), 20560 (1-2 muscles), 20561 (3+ muscles)- Dry Needling, Patient/Family education, Spinal mobilization, and Cryotherapy.  PLAN FOR NEXT SESSION: Ask about 2 falls over the previous 6 months, review day 1 home exercises, progress low back strengthening for lumbar erector spinae, hip abductors and quadratus lumborum.  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   Myer LELON Ivory, PT, MPT 09/10/2024, 5:42 PM

## 2024-09-16 ENCOUNTER — Ambulatory Visit: Admitting: Physical Therapy

## 2024-09-16 ENCOUNTER — Encounter: Payer: Self-pay | Admitting: Physical Therapy

## 2024-09-16 DIAGNOSIS — M6281 Muscle weakness (generalized): Secondary | ICD-10-CM

## 2024-09-16 DIAGNOSIS — M5459 Other low back pain: Secondary | ICD-10-CM | POA: Diagnosis not present

## 2024-09-16 DIAGNOSIS — R293 Abnormal posture: Secondary | ICD-10-CM

## 2024-09-16 NOTE — Therapy (Signed)
 OUTPATIENT PHYSICAL THERAPY THORACOLUMBAR   Patient Name: Paula Richard MRN: [id number] DOB:05-24-1951, 73 y.o., female Today's Date: 09/16/2024  END OF SESSION:  PT End of Session - 09/16/24 1422     Visit Number 2    Number of Visits 16    Date for Recertification  11/05/24    PT Start Time 1345    PT Stop Time 1425    PT Time Calculation (min) 40 min    Activity Tolerance Patient tolerated treatment well;No increased pain;Patient limited by pain    Behavior During Therapy [facility] for tasks assessed/performed           Past Medical History:  Diagnosis Date   Anxiety    Arthritis    back, knees   Cyst of buttocks 02/2018   right   Dental crowns present    FSGS (focal segmental glomerulosclerosis)    states currently in remission   GERD (gastroesophageal reflux disease)    History of Bell's palsy    History of shingles    Hypertension    states under control with med., has been on med. x 30 yr.   Insulin dependent diabetes mellitus    Neuropathy    feet   Seasonal allergies    Past Surgical History:  Procedure Laterality Date   ABDOMINAL HYSTERECTOMY     complete   CARPAL TUNNEL RELEASE Bilateral    FOOT SURGERY Bilateral    MASS EXCISION Right 03/29/2018   Procedure: EXCISION OF RIGHT BUTTOCK CYST;  Surgeon: Gladis Cough, MD;  Location: [facility];  Service: General;  Laterality: Right;   NASAL SEPTUM SURGERY     STERIOD INJECTION Left 05/23/2017   Procedure: LEFT TRIGGER FINGER INJECTION;  Surgeon: Jerri Kay CHRISTELLA, MD;  Location: [facility];  Service: Orthopedics;  Laterality: Left;   TONSILLECTOMY     TRIGGER FINGER RELEASE Right 05/23/2017   Procedure: RIGHT RING FINGER RELEASE TRIGGER FINGER AND TENOLYSIS, LEFT RING FINGER TRIGGER FINGER INJECTION;  Surgeon: Jerri Kay CHRISTELLA, MD;  Location: [facility];  Service: Orthopedics;  Laterality: Right;   TRIGGER FINGER RELEASE Left 12/26/2017   Procedure: RELEASE TRIGGER  FINGER LEFT RING FINGER;  Surgeon: Jerri Kay CHRISTELLA, MD;  Location: [facility];  Service: Orthopedics;  Laterality: Left;   TRIGGER FINGER RELEASE Left 06/11/2019   Procedure: RELEASE TRIGGER FINGER LEFT MIDDLE FINGER;  Surgeon: Jerri Kay CHRISTELLA, MD;  Location:  SURGERY CENTER;  Service: Orthopedics;  Laterality: Left;   Patient Active Problem List   Diagnosis Date Noted   Trigger finger, right middle finger 07/25/2022   Stenosing tenosynovitis of finger of right hand 07/25/2022   Extensor tenosynovitis of left wrist 03/05/2019   Trigger finger, left middle finger 03/05/2019   Hamstring tendinitis of right thigh 03/05/2019   Unilateral primary osteoarthritis, left knee 05/08/2017   Trigger finger, left ring finger 05/08/2017   Trigger finger, right ring finger 10/23/2016   Other chest pain 04/14/2015   Type 2 diabetes mellitus without complication 04/14/2015   Hyperlipidemia 04/14/2015   Essential hypertension 04/14/2015   Epigastric pain 04/14/2015   Palpitations 04/14/2015    PCP: Glendia Freeman, MD  REFERRING PROVIDER: Gerldine Maizes, MD  REFERRING DIAG:  M51.26 (ICD-10-CM) - Other intervertebral disc displacement, lumbar region    Rationale for Evaluation and Treatment: Rehabilitation  THERAPY DIAG:  Abnormal posture  Muscle weakness (generalized)  Other low back pain  ONSET DATE: 4-5 months ago  SUBJECTIVE:  SUBJECTIVE STATEMENT: Pt arriving today reporting 5/10 pain in her low back. Pt also reporting compliance in her HEP.  PERTINENT HISTORY:  Anxiety, OA back and knees, peripheral neuropathy, Type 2 DM, HTN, trigger finger releases, previous lumbar surgery  PAIN:  Are you having pain? Yes: NPRS scale: 5/10 this week Pain location: Mostly right sided but can  be bilateral Pain description: Ache, sore, spasm, can be sharp Aggravating factors: Carrying anything and yard work Relieving factors: Lying down  PRECAUTIONS: Back  RED FLAGS: None   WEIGHT BEARING RESTRICTIONS: No  FALLS:  Has patient fallen in last 6 months? Yes. Number of falls 2, will seek more details visit 2  LIVING ENVIRONMENT: Lives with: lives with their family and lives with their spouse Lives in: House/apartment Stairs: Has some trouble with stairs Has following equipment at home: None  OCCUPATION: Retired, doing everything around the house (husband has a disability)  PLOF: Independent  PATIENT GOALS: Be able to manage household chores without being limited by the back  NEXT MD VISIT: NA  OBJECTIVE:  Note: Objective measures were completed at Evaluation unless otherwise noted.  DIAGNOSTIC FINDINGS:  2021 Pre-surgical MRI: 1. At L5-S1 there is a broad-based disc bulge with a right paracentral disc protrusion which contacts and posteriorly displaces the right S1 nerve root. Mild bilateral foraminal narrowing. 2. At L4-5 there is a broad-based disc osteophyte complex with a small broad central disc protrusion. Mild bilateral facet arthropathy. Bilateral subarticular recess stenosis. Mild spinal stenosis. Mild bilateral foraminal narrowing. 3. Lumbar spine spondylosis as described above.  PATIENT SURVEYS:  PSFS: THE PATIENT SPECIFIC FUNCTIONAL SCALE  Place score of 0-10 (0 = unable to perform activity and 10 = able to perform activity at the same level as before injury or problem)  Activity Date: 09/10/2024    Oralia work 3    2.  Take care of outside animals 7    3.  Sitting 2    4.  Standing 2    Total Score 3.5      Total Score = Sum of activity scores/number of activities  Minimally Detectable Change: 3 points (for single activity); 2 points (for average score)  [name] (nd). The Patient Specific Functional Scale . Retrieved from  [email or web address]   COGNITION: Overall cognitive status: Within functional limits for tasks assessed     SENSATION: Trenell notes no peripheral pain or paresthesias below the gluteal folds.  She has low back and at times gluteal pain.  MUSCLE LENGTH: Hamstrings: Right 45 deg; Left 45 deg  POSTURE: Good posture when standing and Virdie understands the importance of good posture.  However, her body mechanics as described with yard work and other activities around the house will need to improve.  LUMBAR ROM:   AROM 09/10/2024  Flexion   Extension 10  Right lateral flexion   Left lateral flexion   Right rotation   Left rotation    (Blank rows = not tested)  LOWER EXTREMITY ROM:     Passive  Left/Right 09/10/2024   Hip flexion 90/90   Hip extension    Hip abduction    Hip adduction    Hip internal rotation 3/14   Hip external rotation 38/36   Knee flexion    Knee extension    Ankle dorsiflexion    Ankle plantarflexion    Ankle inversion    Ankle eversion     (Blank rows = not tested)  STRENGTH:     Left/Right 09/10/2024  Hip flexion    Hip extension    Hip abduction    Hip adduction    Hip internal rotation    Hip external rotation    Knee flexion    Knee extension    Ankle dorsiflexion    Ankle plantarflexion    Ankle inversion    Ankle eversion    Lumbar extension (prone bilateral arm & leg extension test) 18 seconds (Goal is 30-60)    (Blank rows = not tested)  GAIT: Distance walked: 100 feet Assistive device utilized: None Level of assistance: Complete Independence Comments: Lilley notes she used to walk for exercise but has not done so in quite some time  TREATMENT DATE:  09/16/24 TherEx Supine SLR: 2 x 10 bil LE Standing back extension x 10  Seated shoulder ER in money position Seated glut stretch x 5 holding 10 sec bil Trunk extension elbows on the wall x 10 holding 5  seconds TherAct Nustep: level 5 x 7 minutes Bridge: x 10 holding 3 sec Standing hip extension 2 x 10  Standing hip abd 2 x 10  Rows: green TB x 15 holding 3 sec Shoulder extension red TB x 15 Self Care Instructed pt to wear her resting wrist splint     09/10/2024 Standing lumbar extension AROM 10 x 3 seconds (emphasis on hips forward and hands on gluteals to include the entire lumbar spine) Prone alternating hip extension 2 sets of 10 for 3 seconds     [id number]: Reviewed spine anatomy using the spine model; discussed her previous surgery which was likely a laminectomy and discectomy; discussed the importance of correct body mechanics with activities around the house including logroll, golfers and diagonal squat lifts; reviewed examination findings and her day 1 home exercise program                                                                                                                             PATIENT EDUCATION:  Education details: See above Person educated: Patient Education method: Explanation, Demonstration, Tactile cues, Verbal cues, and Handouts Education comprehension: verbalized understanding, returned demonstration, verbal cues required, tactile cues required, and needs further education  HOME EXERCISE PROGRAM: Access Code: [email or web address] URL: [email or web address] Date: 09/10/2024 Prepared by: Lamar Ivory  Exercises - Standing Lumbar Extension at Wall - Forearms  - 5 x daily - 7 x weekly - 1 sets - 5 reps - 3 seconds hold - Prone Hip Extension  - 2 x daily - 7 x weekly - 2 sets - 10 reps - 3 seconds hold  ASSESSMENT:  CLINICAL IMPRESSION: Pt reporting recent falls when walking her dog. Pt reporting Rt wrist pain. Pt stating she has a resting splint. I instructed to wear her resting splint at night. Treatment focusing on lumbar strengthening and mobility. Recommending continued skilled PT interventions.     OBJECTIVE IMPAIRMENTS: decreased  activity tolerance, decreased endurance, decreased knowledge of condition, decreased ROM, decreased strength, decreased safety awareness,  improper body mechanics, postural dysfunction, and pain.   ACTIVITY LIMITATIONS: carrying, lifting, bending, sitting, standing, stairs, bed mobility, and locomotion level  PARTICIPATION LIMITATIONS: meal prep, cleaning, laundry, driving, shopping, community activity, and yard work  PERSONAL FACTORS: Anxiety, OA back and knees, peripheral neuropathy, Type 2 DM, HTN, trigger finger releases, previous lumbar surgery are also affecting patient's functional outcome.   REHAB POTENTIAL: Good  CLINICAL DECISION MAKING: Evolving/moderate complexity  EVALUATION COMPLEXITY: Moderate   GOALS: Goals reviewed with patient? Yes  SHORT TERM GOALS: Target date: 10/08/2024  Jaide will be independent with her day 1 home exercise program Baseline: Started 09/10/2024 Goal status: ongoing 09/16/24  2.  Improve lumbar extension AROM to 15 degrees Baseline: 10 degrees Goal status: INITIAL  3.  Arline will have a better understanding of correct lifting techniques including golfers lift and diagonal squat lift and will be able to implement these into activities around her house and yard Baseline: Poor body mechanics mentioned and observed at evaluation Goal status: INITIAL   LONG TERM GOALS: Target date: 11/05/2024  Improve patient-specific functional score to at least 6.5 Baseline: 3.5 Goal status: INITIAL  2.  Clarisse will report low back and bilateral gluteal pain no greater than 3/10 on the numeric pain rating scale Baseline: Can be 8/10 Goal status: INITIAL  3.  Gavrielle will be able to maintain the spine strength test position for a minimum of 30 seconds Baseline: 18 seconds Goal status: INITIAL  4.  Jennylee will be independent with her long-term maintenance home exercise program at discharge Baseline: Started 09/10/2024 Goal status:  INITIAL  PLAN:  PT FREQUENCY: 1-2x/week  PT DURATION: 8 weeks  PLANNED INTERVENTIONS: 97750- Physical Performance Testing, 97110-Therapeutic exercises, 97530- Therapeutic activity, 97112- Neuromuscular re-education, 97535- Self Care, [id number]- Manual therapy, [id number]- Electrical stimulation (unattended), 97012- Traction (mechanical), 20560 (1-2 muscles), 20561 (3+ muscles)- Dry Needling, Patient/Family education, Spinal mobilization, and Cryotherapy.  PLAN FOR NEXT SESSION: Continue to progress low back strengthening for lumbar erector spinae, hip abductors and quadratus lumborum.     Delon JONELLE Lunger, PT, MPT 09/16/2024, 3:13 PM

## 2024-09-25 ENCOUNTER — Encounter: Payer: Self-pay | Admitting: Rehabilitative and Restorative Service Providers"

## 2024-09-25 ENCOUNTER — Ambulatory Visit: Admitting: Rehabilitative and Restorative Service Providers"

## 2024-09-25 DIAGNOSIS — M5459 Other low back pain: Secondary | ICD-10-CM | POA: Diagnosis not present

## 2024-09-25 DIAGNOSIS — R293 Abnormal posture: Secondary | ICD-10-CM

## 2024-09-25 DIAGNOSIS — M6281 Muscle weakness (generalized): Secondary | ICD-10-CM | POA: Diagnosis not present

## 2024-09-25 NOTE — Therapy (Signed)
 OUTPATIENT PHYSICAL THERAPY THORACOLUMBAR TREATMENT  Patient Name: Paula Richard MRN: [id number] DOB:[birth year], 73 y.o., female Today's Date: 09/25/2024  END OF SESSION:  PT End of Session - 09/25/24 1103     Visit Number 3    Number of Visits 16    Date for Recertification  11/05/24    PT Start Time 1103    PT Stop Time 1143    PT Time Calculation (min) 40 min    Activity Tolerance Patient tolerated treatment well;No increased pain    Behavior During Therapy WFL for tasks assessed/performed            Past Medical History:  Diagnosis Date   Anxiety    Arthritis    back, knees   Cyst of buttocks 02/2018   right   Dental crowns present    FSGS (focal segmental glomerulosclerosis)    states currently in remission   GERD (gastroesophageal reflux disease)    History of Bell's palsy    History of shingles    Hypertension    states under control with med., has been on med. x 30 yr.   Insulin dependent diabetes mellitus    Neuropathy    feet   Seasonal allergies    Past Surgical History:  Procedure Laterality Date   ABDOMINAL HYSTERECTOMY     complete   CARPAL TUNNEL RELEASE Bilateral    FOOT SURGERY Bilateral    MASS EXCISION Right 03/29/2018   Procedure: EXCISION OF RIGHT BUTTOCK CYST;  Surgeon: Gladis Cough, MD;  Location: [facility];  Service: General;  Laterality: Right;   NASAL SEPTUM SURGERY     STERIOD INJECTION Left 05/23/2017   Procedure: LEFT TRIGGER FINGER INJECTION;  Surgeon: Jerri Kay CHRISTELLA, MD;  Location: [facility];  Service: Orthopedics;  Laterality: Left;   TONSILLECTOMY     TRIGGER FINGER RELEASE Right 05/23/2017   Procedure: RIGHT RING FINGER RELEASE TRIGGER FINGER AND TENOLYSIS, LEFT RING FINGER TRIGGER FINGER INJECTION;  Surgeon: Jerri Kay CHRISTELLA, MD;  Location: [facility];  Service: Orthopedics;  Laterality: Right;   TRIGGER FINGER RELEASE Left 12/26/2017   Procedure: RELEASE TRIGGER FINGER LEFT  RING FINGER;  Surgeon: Jerri Kay CHRISTELLA, MD;  Location: [facility];  Service: Orthopedics;  Laterality: Left;   TRIGGER FINGER RELEASE Left 06/11/2019   Procedure: RELEASE TRIGGER FINGER LEFT MIDDLE FINGER;  Surgeon: Jerri Kay CHRISTELLA, MD;  Location: [facility];  Service: Orthopedics;  Laterality: Left;   Patient Active Problem List   Diagnosis Date Noted   Trigger finger, right middle finger 07/25/2022   Stenosing tenosynovitis of finger of right hand 07/25/2022   Extensor tenosynovitis of left wrist 03/05/2019   Trigger finger, left middle finger 03/05/2019   Hamstring tendinitis of right thigh 03/05/2019   Unilateral primary osteoarthritis, left knee 05/08/2017   Trigger finger, left ring finger 05/08/2017   Trigger finger, right ring finger 10/23/2016   Other chest pain 04/14/2015   Type 2 diabetes mellitus without complication 04/14/2015   Hyperlipidemia 04/14/2015   Essential hypertension 04/14/2015   Epigastric pain 04/14/2015   Palpitations 04/14/2015    PCP: Glendia Freeman, MD  REFERRING PROVIDER: Gerldine Maizes, MD  REFERRING DIAG:  M51.26 (ICD-10-CM) - Other intervertebral disc displacement, lumbar region    Rationale for Evaluation and Treatment: Rehabilitation  THERAPY DIAG:  Abnormal posture  Muscle weakness (generalized)  Other low back pain  ONSET DATE: 4-5 months ago  SUBJECTIVE:  SUBJECTIVE STATEMENT: Elka walked 2 miles a day the last 3 days.  Low back pain is similar.  Some right sided radicular symptoms to 1/3 way down the thigh.  PERTINENT HISTORY:  Anxiety, OA back and knees, peripheral neuropathy, Type 2 DM, HTN, trigger finger releases, previous lumbar surgery  PAIN:  Are you having pain? Yes: NPRS scale: 3-4/10 this week Pain  location: Mostly right sided but can be bilateral Pain description: Ache, sore, spasm, can be sharp Aggravating factors: Carrying anything and yard work Relieving factors: Lying down  PRECAUTIONS: Back  RED FLAGS: None   WEIGHT BEARING RESTRICTIONS: No  FALLS:  Has patient fallen in last 6 months? Yes. Number of falls 2, will seek more details visit 2  LIVING ENVIRONMENT: Lives with: lives with their family and lives with their spouse Lives in: House/apartment Stairs: Has some trouble with stairs Has following equipment at home: None  OCCUPATION: Retired, doing everything around the house (husband has a disability)  PLOF: Independent  PATIENT GOALS: Be able to manage household chores without being limited by the back  NEXT MD VISIT: NA  OBJECTIVE:  Note: Objective measures were completed at Evaluation unless otherwise noted.  DIAGNOSTIC FINDINGS:  2021 Pre-surgical MRI: 1. At L5-S1 there is a broad-based disc bulge with a right paracentral disc protrusion which contacts and posteriorly displaces the right S1 nerve root. Mild bilateral foraminal narrowing. 2. At L4-5 there is a broad-based disc osteophyte complex with a small broad central disc protrusion. Mild bilateral facet arthropathy. Bilateral subarticular recess stenosis. Mild spinal stenosis. Mild bilateral foraminal narrowing. 3. Lumbar spine spondylosis as described above.  PATIENT SURVEYS:  PSFS: THE PATIENT SPECIFIC FUNCTIONAL SCALE  Place score of 0-10 (0 = unable to perform activity and 10 = able to perform activity at the same level as before injury or problem)  Activity Date: 09/10/2024    Oralia work 3    2.  Take care of outside animals 7    3.  Sitting 2    4.  Standing 2    Total Score 3.5      Total Score = Sum of activity scores/number of activities  Minimally Detectable Change: 3 points (for single activity); 2 points (for average score)  [name] (nd). The Patient  Specific Functional Scale . Retrieved from [email or web address]   COGNITION: Overall cognitive status: Within functional limits for tasks assessed     SENSATION: Uriah notes no peripheral pain or paresthesias below the gluteal folds.  She has low back and at times gluteal pain.  MUSCLE LENGTH: Hamstrings: Right 45 deg; Left 45 deg  POSTURE: Good posture when standing and Regenia understands the importance of good posture.  However, her body mechanics as described with yard work and other activities around the house will need to improve.  LUMBAR ROM:   AROM 09/10/2024  Flexion   Extension 10  Right lateral flexion   Left lateral flexion   Right rotation   Left rotation    (Blank rows = not tested)  LOWER EXTREMITY ROM:     Passive  Left/Right 09/10/2024   Hip flexion 90/90   Hip extension    Hip abduction    Hip adduction    Hip internal rotation 3/14   Hip external rotation 38/36   Knee flexion    Knee extension    Ankle dorsiflexion    Ankle plantarflexion    Ankle inversion    Ankle eversion     (Blank rows =  not tested)  STRENGTH:     Left/Right 09/10/2024   Hip flexion    Hip extension    Hip abduction    Hip adduction    Hip internal rotation    Hip external rotation    Knee flexion    Knee extension    Ankle dorsiflexion    Ankle plantarflexion    Ankle inversion    Ankle eversion    Lumbar extension (prone bilateral arm & leg extension test) 18 seconds (Goal is 30-60)    (Blank rows = not tested)  GAIT: Distance walked: 100 feet Assistive device utilized: None Level of assistance: Complete Independence Comments: Nelline notes she used to walk for exercise but has not done so in quite some time  TREATMENT DATE:  09/25/2024 Standing lumbar extension AROM 10 x 3 seconds (emphasis on hips forward and hands on gluteals to include the entire lumbar spine), less about range and more about  reps Prone alternating hip extension 2 sets of 10 for 3 seconds, cues to turn sideways on the bed and have knees at the edge of the bed Yoga Bridge 2 sets of 10 for 5 seconds  Functional Activities: Hip hike in doorframe 2 sets of 5 for 3 seconds (posture and gait correction) Hip hike at countertop 2 sets of 10 for 3 seconds (posture and gait correction) Practical logroll for bed mobility; practical golfers lift for taking care of animals and light lifting; diagonal squat lift for heavier lifting like laundry; practical body mechanics with sweeping, vacuuming and doing dishes   09/16/24 TherEx Supine SLR: 2 x 10 bil LE Standing back extension x 10  Seated shoulder ER in money position Seated glut stretch x 5 holding 10 sec bil Trunk extension elbows on the wall x 10 holding 5 seconds TherAct Nustep: level 5 x 7 minutes Bridge: x 10 holding 3 sec Standing hip extension 2 x 10  Standing hip abd 2 x 10  Rows: green TB x 15 holding 3 sec Shoulder extension red TB x 15 Self Care Instructed pt to wear her resting wrist splint   09/10/2024 Standing lumbar extension AROM 10 x 3 seconds (emphasis on hips forward and hands on gluteals to include the entire lumbar spine) Prone alternating hip extension 2 sets of 10 for 3 seconds     [id number]: Reviewed spine anatomy using the spine model; discussed her previous surgery which was likely a laminectomy and discectomy; discussed the importance of correct body mechanics with activities around the house including logroll, golfers and diagonal squat lifts; reviewed examination findings and her day 1 home exercise program                                                                                                                             PATIENT EDUCATION:  Education details: See above Person educated: Patient Education method: Explanation, Demonstration, Tactile cues, Verbal cues, and Handouts Education comprehension: verbalized  understanding, returned demonstration, verbal  cues required, tactile cues required, and needs further education  HOME EXERCISE PROGRAM: Access Code: [email or web address] URL: [email or web address] Date: 09/25/2024 Prepared by: Lamar Ivory  Exercises - Standing Lumbar Extension at Wall - Forearms  - 5 x daily - 7 x weekly - 1 sets - 5 reps - 3 seconds hold - Prone Hip Extension  - 2 x daily - 7 x weekly - 2 sets - 10 reps - 3-10 seconds hold - Yoga Bridge  - 2 x daily - 7 x weekly - 2 sets - 10 reps - 5-10 seconds hold - Standing Hip Hiking  - 5 x daily - 7 x weekly - 1 sets - 10 reps - 3-5 seconds hold  ASSESSMENT:  CLINICAL IMPRESSION: Jaziya reports good compliance with her current walking program.  She admits she has not been doing her prone lumbar strengthening and we made some modifications to make this easier to do at home.  We also spent quite a bit of time, the majority of today's visit on practical body mechanics work as she is the primary caregiver for her husband.  Rada will continue to benefit from supervised physical therapy for low back strength, posture and body mechanics work and to meet all long-term goals established at evaluation.  OBJECTIVE IMPAIRMENTS: decreased activity tolerance, decreased endurance, decreased knowledge of condition, decreased ROM, decreased strength, decreased safety awareness, improper body mechanics, postural dysfunction, and pain.   ACTIVITY LIMITATIONS: carrying, lifting, bending, sitting, standing, stairs, bed mobility, and locomotion level  PARTICIPATION LIMITATIONS: meal prep, cleaning, laundry, driving, shopping, community activity, and yard work  PERSONAL FACTORS: Anxiety, OA back and knees, peripheral neuropathy, Type 2 DM, HTN, trigger finger releases, previous lumbar surgery are also affecting patient's functional outcome.   REHAB POTENTIAL: Good  CLINICAL DECISION MAKING: Evolving/moderate complexity  EVALUATION  COMPLEXITY: Moderate   GOALS: Goals reviewed with patient? Yes  SHORT TERM GOALS: Target date: 10/08/2024  Micheale will be independent with her day 1 home exercise program Baseline: Started 09/10/2024 Goal status: Ongoing 09/25/2024  2.  Improve lumbar extension AROM to 15 degrees Baseline: 10 degrees Goal status: INITIAL  3.  Arline will have a better understanding of correct lifting techniques including golfers lift and diagonal squat lift and will be able to implement these into activities around her house and yard Baseline: Poor body mechanics mentioned and observed at evaluation Goal status: Ongoing 09/25/2024   LONG TERM GOALS: Target date: 11/05/2024  Improve patient-specific functional score to at least 6.5 Baseline: 3.5 Goal status: INITIAL  2.  Cedric will report low back and bilateral gluteal pain no greater than 3/10 on the numeric pain rating scale Baseline: Can be 8/10 Goal status: INITIAL  3.  Annalicia will be able to maintain the spine strength test position for a minimum of 30 seconds Baseline: 18 seconds Goal status: INITIAL  4.  Mikiyah will be independent with her long-term maintenance home exercise program at discharge Baseline: Started 09/10/2024 Goal status: INITIAL  PLAN:  PT FREQUENCY: 1-2x/week  PT DURATION: 8 weeks  PLANNED INTERVENTIONS: 97750- Physical Performance Testing, 97110-Therapeutic exercises, 97530- Therapeutic activity, 97112- Neuromuscular re-education, 97535- Self Care, [id number]- Manual therapy, [id number]- Electrical stimulation (unattended), 97012- Traction (mechanical), 20560 (1-2 muscles), 20561 (3+ muscles)- Dry Needling, Patient/Family education, Spinal mobilization, and Cryotherapy.  PLAN FOR NEXT SESSION: Continue to progress low back strengthening for lumbar erector spinae, hip abductors and quadratus lumborum.  Practical posture and body mechanics work as Kristel is the primary caregiver for her husband with a  disability.   Myer LELON Ivory, PT, MPT 09/25/2024, 11:59 AM

## 2024-09-26 ENCOUNTER — Encounter: Admitting: Rehabilitative and Restorative Service Providers"

## 2024-09-27 DIAGNOSIS — E119 Type 2 diabetes mellitus without complications: Secondary | ICD-10-CM | POA: Diagnosis not present

## 2024-10-01 ENCOUNTER — Ambulatory Visit: Admitting: Rehabilitative and Restorative Service Providers"

## 2024-10-01 ENCOUNTER — Encounter: Payer: Self-pay | Admitting: Rehabilitative and Restorative Service Providers"

## 2024-10-01 DIAGNOSIS — M5459 Other low back pain: Secondary | ICD-10-CM

## 2024-10-01 DIAGNOSIS — M6281 Muscle weakness (generalized): Secondary | ICD-10-CM

## 2024-10-01 DIAGNOSIS — R293 Abnormal posture: Secondary | ICD-10-CM

## 2024-10-01 NOTE — Therapy (Signed)
 OUTPATIENT PHYSICAL THERAPY THORACOLUMBAR TREATMENT  Patient Name: Paula Richard MRN: [id number] DOB:05-25-1951, 73 y.o., female Today's Date: 10/01/2024  END OF SESSION:  PT End of Session - 10/01/24 1601     Visit Number 4    Number of Visits 16    Date for Recertification  11/05/24    PT Start Time 1601    PT Stop Time 1642    PT Time Calculation (min) 41 min    Activity Tolerance Patient tolerated treatment well;No increased pain    Behavior During Therapy WFL for tasks assessed/performed             Past Medical History:  Diagnosis Date   Anxiety    Arthritis    back, knees   Cyst of buttocks 02/2018   right   Dental crowns present    FSGS (focal segmental glomerulosclerosis)    states currently in remission   GERD (gastroesophageal reflux disease)    History of Bell's palsy    History of shingles    Hypertension    states under control with med., has been on med. x 30 yr.   Insulin dependent diabetes mellitus    Neuropathy    feet   Seasonal allergies    Past Surgical History:  Procedure Laterality Date   ABDOMINAL HYSTERECTOMY     complete   CARPAL TUNNEL RELEASE Bilateral    FOOT SURGERY Bilateral    MASS EXCISION Right 03/29/2018   Procedure: EXCISION OF RIGHT BUTTOCK CYST;  Surgeon: Paula Cough, MD;  Location: [facility];  Service: General;  Laterality: Right;   NASAL SEPTUM SURGERY     STERIOD INJECTION Left 05/23/2017   Procedure: LEFT TRIGGER FINGER INJECTION;  Surgeon: Paula Kay CHRISTELLA, MD;  Location: [facility];  Service: Orthopedics;  Laterality: Left;   TONSILLECTOMY     TRIGGER FINGER RELEASE Right 05/23/2017   Procedure: RIGHT RING FINGER RELEASE TRIGGER FINGER AND TENOLYSIS, LEFT RING FINGER TRIGGER FINGER INJECTION;  Surgeon: Paula Kay CHRISTELLA, MD;  Location: [facility];  Service: Orthopedics;  Laterality: Right;   TRIGGER FINGER RELEASE Left 12/26/2017   Procedure: RELEASE TRIGGER FINGER LEFT  RING FINGER;  Surgeon: Paula Kay CHRISTELLA, MD;  Location: [facility];  Service: Orthopedics;  Laterality: Left;   TRIGGER FINGER RELEASE Left 06/11/2019   Procedure: RELEASE TRIGGER FINGER LEFT MIDDLE FINGER;  Surgeon: Paula Kay CHRISTELLA, MD;  Location: [facility];  Service: Orthopedics;  Laterality: Left;   Patient Active Problem List   Diagnosis Date Noted   Trigger finger, right middle finger 07/25/2022   Stenosing tenosynovitis of finger of right hand 07/25/2022   Extensor tenosynovitis of left wrist 03/05/2019   Trigger finger, left middle finger 03/05/2019   Hamstring tendinitis of right thigh 03/05/2019   Unilateral primary osteoarthritis, left knee 05/08/2017   Trigger finger, left ring finger 05/08/2017   Trigger finger, right ring finger 10/23/2016   Other chest pain 04/14/2015   Type 2 diabetes mellitus without complication 04/14/2015   Hyperlipidemia 04/14/2015   Essential hypertension 04/14/2015   Epigastric pain 04/14/2015   Palpitations 04/14/2015    PCP: Paula Freeman, MD  REFERRING PROVIDER: Gerldine Maizes, MD  REFERRING DIAG:  M51.26 (ICD-10-CM) - Other intervertebral disc displacement, lumbar region    Rationale for Evaluation and Treatment: Rehabilitation  THERAPY DIAG:  Abnormal posture  Muscle weakness (generalized)  Other low back pain  ONSET DATE: 4-5 months ago  SUBJECTIVE:  SUBJECTIVE STATEMENT: Paula Richard notes stiffness with a car trip to [employer].  She continues to be very compliant with her walking and home exercises.  Low back and right sided radicular symptoms to 1/3 way down the thigh all the primary reasons for her physical therapy.  PERTINENT HISTORY:  Anxiety, OA back and knees, peripheral neuropathy, Type 2 DM, HTN, trigger  finger releases, previous lumbar surgery  PAIN:  Are you having pain? Yes: NPRS scale: Remains 3-4/10 this week Pain location: Mostly right sided but can be bilateral Pain description: Ache, sore, spasm, can be sharp Aggravating factors: Carrying anything, prolonged postures and yard work Relieving factors: Lying down  PRECAUTIONS: Back  RED FLAGS: None   WEIGHT BEARING RESTRICTIONS: No  FALLS:  Has patient fallen in last 6 months? Yes. Number of falls 2, will seek more details visit 2  LIVING ENVIRONMENT: Lives with: lives with their family and lives with their spouse Lives in: House/apartment Stairs: Has some trouble with stairs Has following equipment at home: None  OCCUPATION: Retired, doing everything around the house (husband has a disability)  PLOF: Independent  PATIENT GOALS: Be able to manage household chores without being limited by the back  NEXT MD VISIT: NA  OBJECTIVE:  Note: Objective measures were completed at Evaluation unless otherwise noted.  DIAGNOSTIC FINDINGS:  2021 Pre-surgical MRI: 1. At L5-S1 there is a broad-based disc bulge with a right paracentral disc protrusion which contacts and posteriorly displaces the right S1 nerve root. Mild bilateral foraminal narrowing. 2. At L4-5 there is a broad-based disc osteophyte complex with a small broad central disc protrusion. Mild bilateral facet arthropathy. Bilateral subarticular recess stenosis. Mild spinal stenosis. Mild bilateral foraminal narrowing. 3. Lumbar spine spondylosis as described above.  PATIENT SURVEYS:  PSFS: THE PATIENT SPECIFIC FUNCTIONAL SCALE  Place score of 0-10 (0 = unable to perform activity and 10 = able to perform activity at the same level as before injury or problem)  Activity Date: 09/10/2024    Oralia work 3    2.  Take care of outside animals 7    3.  Sitting 2    4.  Standing 2    Total Score 3.5      Total Score = Sum of activity scores/number of  activities  Minimally Detectable Change: 3 points (for single activity); 2 points (for average score)  Paula Richard (nd). The Patient Specific Functional Scale . Retrieved from [email or web address]   COGNITION: Overall cognitive status: Within functional limits for tasks assessed     SENSATION: Paula Richard notes no peripheral pain or paresthesias below the gluteal folds.  She has low back and at times gluteal pain.  MUSCLE LENGTH: Hamstrings: Right 45 deg; Left 45 deg  POSTURE: Good posture when standing and Keiran understands the importance of good posture.  However, her body mechanics as described with yard work and other activities around the house will need to improve.  LUMBAR ROM:   AROM 09/10/2024  Flexion   Extension 10  Right lateral flexion   Left lateral flexion   Right rotation   Left rotation    (Blank rows = not tested)  LOWER EXTREMITY ROM:     Passive  Left/Right 09/10/2024   Hip flexion 90/90   Hip extension    Hip abduction    Hip adduction    Hip internal rotation 3/14   Hip external rotation 38/36   Knee flexion    Knee extension    Ankle dorsiflexion  Ankle plantarflexion    Ankle inversion    Ankle eversion     (Blank rows = not tested)  STRENGTH:     Left/Right 09/10/2024   Hip flexion    Hip extension    Hip abduction    Hip adduction    Hip internal rotation    Hip external rotation    Knee flexion    Knee extension    Ankle dorsiflexion    Ankle plantarflexion    Ankle inversion    Ankle eversion    Lumbar extension (prone bilateral arm & leg extension test) 18 seconds (Goal is 30-60)    (Blank rows = not tested)  GAIT: Distance walked: 100 feet Assistive device utilized: None Level of assistance: Complete Independence Comments: Ivonne notes she used to walk for exercise but has not done so in quite some time  TREATMENT DATE:  10/01/2024 Standing lumbar  extension AROM 10 x 3 seconds (emphasis on hips forward and hands on gluteals to include the entire lumbar spine),  Prone alternating hip extension 10 for 5 seconds Prone alternating arm and leg extensions 10 x 3 seconds Yoga Bridge 2 sets of 10 for 7 seconds  Functional Activities: Hip hike in doorframe 2 sets of 5 for 3 seconds (posture and gait correction, but not as much as her last visit) Hip hike at countertop 2 sets of 10 for 3 seconds (cues to pick a spot on the wall to avoid leaning side-to-side) Scapular retraction/shoulder blade pinches 10 x 5 seconds (posture) Thera-Band rows/pull to chest for postural correction blue 10 x 3 seconds Thera-Band shoulder extension with palms up for scapular positioning and posture Red 10 x 3 seconds Practical education discussing the importance of taking frequent breaks when driving to avoid prolonged sitting, perhaps reclining or chair for decreased lumbar disc pressure and correct use of a lumbar roll   09/25/2024 Standing lumbar extension AROM 10 x 3 seconds (emphasis on hips forward and hands on gluteals to include the entire lumbar spine), less about range and more about reps Prone alternating hip extension 2 sets of 10 for 3 seconds, cues to turn sideways on the bed and have knees at the edge of the bed Yoga Bridge 2 sets of 10 for 5 seconds  Functional Activities: Hip hike in doorframe 2 sets of 5 for 3 seconds (posture and gait correction) Hip hike at countertop 2 sets of 10 for 5 seconds (posture and gait correction) Practical logroll for bed mobility; practical golfers lift for taking care of animals and light lifting; diagonal squat lift for heavier lifting like laundry; practical body mechanics with sweeping, vacuuming and doing dishes   09/16/24 TherEx Supine SLR: 2 x 10 bil LE Standing back extension x 10  Seated shoulder ER in money position Seated glut stretch x 5 holding 10 sec bil Trunk extension elbows on the wall x 10  holding 5 seconds TherAct Nustep: level 5 x 7 minutes Bridge: x 10 holding 3 sec Standing hip extension 2 x 10  Standing hip abd 2 x 10  Rows: green TB x 15 holding 3 sec Shoulder extension red TB x 15 Self Care Instructed pt to wear her resting wrist splint  PATIENT EDUCATION:  Education details: See above Person educated: Patient Education method: Explanation, Demonstration, Tactile cues, Verbal cues, and Handouts Education comprehension: verbalized understanding, returned demonstration, verbal cues required, tactile cues required, and needs further education  HOME EXERCISE PROGRAM: Access Code: [email or web address] URL: [email or web address] Date: 10/01/2024 Prepared by:  Lamar Ivory  Exercises - Standing Lumbar Extension at [employer] - [employer]arms  - 5 x daily - 7 x weekly - 1 sets - 5 reps - 3 seconds hold - Prone Hip Extension  - 2 x daily - 7 x weekly - 2 sets - 10 reps - 5-10 seconds hold - Yoga Bridge  - 2 x daily - 7 x weekly - 2 sets - 10 reps - 7-10 seconds hold - Standing Hip Hiking  - 5 x daily - 7 x weekly - 1 sets - 10 reps - 5-10 seconds hold - Standing Scapular Retraction  - 5 x daily - 7 x weekly - 1 sets - 5 reps - 5 second hold  ASSESSMENT:  CLINICAL IMPRESSION: Armanii reports good compliance with her home exercises and home walking program.  She has been aware of her posture and body mechanics and is making progress in this area.  Because she is the primary caregiver for her husband with a disability, she is very physically active and will need to have very good low back strength along with very good body mechanics to continue to do well long-term.  Taviana will continue to benefit from the recommended plan of care to meet long-term goals.  OBJECTIVE IMPAIRMENTS: decreased activity tolerance, decreased endurance, decreased knowledge of condition, decreased ROM, decreased strength, decreased safety awareness, improper body mechanics, postural dysfunction, and  pain.   ACTIVITY LIMITATIONS: carrying, lifting, bending, sitting, standing, stairs, bed mobility, and locomotion level  PARTICIPATION LIMITATIONS: meal prep, cleaning, laundry, driving, shopping, community activity, and yard work  PERSONAL FACTORS: Anxiety, OA back and knees, peripheral neuropathy, Type 2 DM, HTN, trigger finger releases, previous lumbar surgery are also affecting patient's functional outcome.   REHAB POTENTIAL: Good  CLINICAL DECISION MAKING: Evolving/moderate complexity  EVALUATION COMPLEXITY: Moderate   GOALS: Goals reviewed with patient? Yes  SHORT TERM GOALS: Target date: 10/08/2024  Keylin will be independent with her day 1 home exercise program Baseline: Started 09/10/2024 Goal status: Met 10/01/2024  2.  Improve lumbar extension AROM to 15 degrees Baseline: 10 degrees Goal status: INITIAL  3.  Arline will have a better understanding of correct lifting techniques including golfers lift and diagonal squat lift and will be able to implement these into activities around her house and yard Baseline: Poor body mechanics mentioned and observed at evaluation Goal status: Partially met 10/01/2024   LONG TERM GOALS: Target date: 11/05/2024  Improve patient-specific functional score to at least 6.5 Baseline: 3.5 Goal status: INITIAL  2.  Lourie will report low back and bilateral gluteal pain no greater than 3/10 on the numeric pain rating scale Baseline: Can be 8/10 Goal status: INITIAL  3.  Alleyne will be able to maintain the spine strength test position for a minimum of 30 seconds Baseline: 18 seconds Goal status: INITIAL  4.  Ernestina will be independent with her long-term maintenance home exercise program at discharge Baseline: Started 09/10/2024 Goal status: INITIAL  PLAN:  PT FREQUENCY: 1-2x/week  PT DURATION: 8 weeks  PLANNED INTERVENTIONS: 97750- Physical Performance Testing, 97110-Therapeutic exercises, 97530- Therapeutic activity,  97112- Neuromuscular re-education, 97535- Self Care, [id number]- Manual therapy, [id number]- Electrical stimulation (unattended), 97012- Traction (mechanical), 20560 (1-2 muscles), 20561 (3+ muscles)- Dry Needling, Patient/Family education, Spinal mobilization, and Cryotherapy.  PLAN FOR NEXT SESSION: Continue to progress low back strengthening for lumbar erector spinae, hip abductors and quadratus lumborum.  Practical posture and body mechanics work as Daney is the primary caregiver for her husband with a disability.  An update on the patient's specific functional scale and some objective measures for goal updates.   Myer LELON Ivory, PT, MPT 10/01/2024, 4:49 PM

## 2024-10-03 ENCOUNTER — Ambulatory Visit: Admitting: Rehabilitative and Restorative Service Providers"

## 2024-10-03 ENCOUNTER — Encounter: Payer: Self-pay | Admitting: Rehabilitative and Restorative Service Providers"

## 2024-10-03 DIAGNOSIS — M6281 Muscle weakness (generalized): Secondary | ICD-10-CM | POA: Diagnosis not present

## 2024-10-03 DIAGNOSIS — M5459 Other low back pain: Secondary | ICD-10-CM | POA: Diagnosis not present

## 2024-10-03 DIAGNOSIS — R293 Abnormal posture: Secondary | ICD-10-CM | POA: Diagnosis not present

## 2024-10-03 NOTE — Therapy (Signed)
 OUTPATIENT PHYSICAL THERAPY THORACOLUMBAR TREATMENT  Patient Name: Paula Richard MRN: [id number] DOB:1951-01-17, 74 y.o., female Today's Date: 10/03/2024  END OF SESSION:  PT End of Session - 10/03/24 1350     Visit Number 5    Number of Visits 16    Date for Recertification  11/05/24    PT Start Time 1350    PT Stop Time 1433    PT Time Calculation (min) 43 min    Activity Tolerance Patient tolerated treatment well;No increased pain    Behavior During Therapy WFL for tasks assessed/performed              Past Medical History:  Diagnosis Date   Anxiety    Arthritis    back, knees   Cyst of buttocks 02/2018   right   Dental crowns present    FSGS (focal segmental glomerulosclerosis)    states currently in remission   GERD (gastroesophageal reflux disease)    History of Bell's palsy    History of shingles    Hypertension    states under control with med., has been on med. x 30 yr.   Insulin dependent diabetes mellitus    Neuropathy    feet   Seasonal allergies    Past Surgical History:  Procedure Laterality Date   ABDOMINAL HYSTERECTOMY     complete   CARPAL TUNNEL RELEASE Bilateral    FOOT SURGERY Bilateral    MASS EXCISION Right 03/29/2018   Procedure: EXCISION OF RIGHT BUTTOCK CYST;  Surgeon: Gladis Cough, MD;  Location: [facility];  Service: General;  Laterality: Right;   NASAL SEPTUM SURGERY     STERIOD INJECTION Left 05/23/2017   Procedure: LEFT TRIGGER FINGER INJECTION;  Surgeon: Jerri Kay CHRISTELLA, MD;  Location: [facility];  Service: Orthopedics;  Laterality: Left;   TONSILLECTOMY     TRIGGER FINGER RELEASE Right 05/23/2017   Procedure: RIGHT RING FINGER RELEASE TRIGGER FINGER AND TENOLYSIS, LEFT RING FINGER TRIGGER FINGER INJECTION;  Surgeon: Jerri Kay CHRISTELLA, MD;  Location: [facility];  Service: Orthopedics;  Laterality: Right;   TRIGGER FINGER RELEASE Left 12/26/2017   Procedure: RELEASE TRIGGER FINGER LEFT  RING FINGER;  Surgeon: Jerri Kay CHRISTELLA, MD;  Location: [facility];  Service: Orthopedics;  Laterality: Left;   TRIGGER FINGER RELEASE Left 06/11/2019   Procedure: RELEASE TRIGGER FINGER LEFT MIDDLE FINGER;  Surgeon: Jerri Kay CHRISTELLA, MD;  Location: [facility];  Service: Orthopedics;  Laterality: Left;   Patient Active Problem List   Diagnosis Date Noted   Trigger finger, right middle finger 07/25/2022   Stenosing tenosynovitis of finger of right hand 07/25/2022   Extensor tenosynovitis of left wrist 03/05/2019   Trigger finger, left middle finger 03/05/2019   Hamstring tendinitis of right thigh 03/05/2019   Unilateral primary osteoarthritis, left knee 05/08/2017   Trigger finger, left ring finger 05/08/2017   Trigger finger, right ring finger 10/23/2016   Other chest pain 04/14/2015   Type 2 diabetes mellitus without complication 04/14/2015   Hyperlipidemia 04/14/2015   Essential hypertension 04/14/2015   Epigastric pain 04/14/2015   Palpitations 04/14/2015    PCP: Glendia Freeman, MD  REFERRING PROVIDER: Gerldine Maizes, MD  REFERRING DIAG:  M51.26 (ICD-10-CM) - Other intervertebral disc displacement, lumbar region    Rationale for Evaluation and Treatment: Rehabilitation  THERAPY DIAG:  Abnormal posture  Muscle weakness (generalized)  Other low back pain  ONSET DATE: 4-5 months ago  SUBJECTIVE:  SUBJECTIVE STATEMENT: Paula Richard notes functional progress since starting supervised PT.  Low back and right sided radicular symptoms to 1/3 way down the thigh are keeping her from full participation with house chores and ADLs.  PERTINENT HISTORY:  Anxiety, OA back and knees, peripheral neuropathy, Type 2 DM, HTN, trigger finger releases, previous lumbar surgery  PAIN:   Are you having pain? Yes: NPRS scale: Still 3-4/10 this week Pain location: Mostly right sided but can be bilateral Pain description: Ache, sore, spasm, can be sharp Aggravating factors: Carrying anything, prolonged postures and yard work Relieving factors: Lying down  PRECAUTIONS: Back  RED FLAGS: None   WEIGHT BEARING RESTRICTIONS: No  FALLS:  Has patient fallen in last 6 months? Yes. Number of falls 2, will seek more details visit 2  LIVING ENVIRONMENT: Lives with: lives with their family and lives with their spouse Lives in: House/apartment Stairs: Has some trouble with stairs Has following equipment at home: None  OCCUPATION: Retired, doing everything around the house (husband has a disability)  PLOF: Independent  PATIENT GOALS: Be able to manage household chores without being limited by the back  NEXT MD VISIT: NA  OBJECTIVE:  Note: Objective measures were completed at Evaluation unless otherwise noted.  DIAGNOSTIC FINDINGS:  2021 Pre-surgical MRI: 1. At L5-S1 there is a broad-based disc bulge with a right paracentral disc protrusion which contacts and posteriorly displaces the right S1 nerve root. Mild bilateral foraminal narrowing. 2. At L4-5 there is a broad-based disc osteophyte complex with a small broad central disc protrusion. Mild bilateral facet arthropathy. Bilateral subarticular recess stenosis. Mild spinal stenosis. Mild bilateral foraminal narrowing. 3. Lumbar spine spondylosis as described above.  PATIENT SURVEYS:  PSFS: THE PATIENT SPECIFIC FUNCTIONAL SCALE  Place score of 0-10 (0 = unable to perform activity and 10 = able to perform activity at the same level as before injury or problem)  Activity Date: 09/10/2024 10/03/2024   Oralia work 3 4   2.  Take care of outside animals 7 8   3.  Sitting 2 5   4.  Standing 2 7   Total Score 3.5 6     Total Score = Sum of activity scores/number of activities  Minimally Detectable Change: 3 points  (for single activity); 2 points (for average score)  [facility] (nd). The Patient Specific Functional Scale . Retrieved from [email or web address]   COGNITION: Overall cognitive status: Within functional limits for tasks assessed     SENSATION: Paula Richard notes no peripheral pain or paresthesias below the gluteal folds.  She has low back and at times gluteal pain.  MUSCLE LENGTH: 10/03/2024: Hamstrings: Right 50 deg; Left 45 deg  Evaluation: Hamstrings: Right 45 deg; Left 45 deg  POSTURE: Good posture when standing and Paula Richard understands the importance of good posture.  However, her body mechanics as described with yard work and other activities around the house will need to improve.  LUMBAR ROM:   AROM 09/10/2024 11/?/2025  Flexion    Extension 10   Right lateral flexion    Left lateral flexion    Right rotation    Left rotation     (Blank rows = not tested)  LOWER EXTREMITY ROM:     Passive  Left/Right 09/10/2024 Left/Right 10/03/2024  Hip flexion 90/90 95/100  Hip extension    Hip abduction    Hip adduction    Hip internal rotation 3/14 12/12  Hip external rotation 38/36 36/38  Knee flexion    Knee extension  Ankle dorsiflexion    Ankle plantarflexion    Ankle inversion    Ankle eversion     (Blank rows = not tested)  STRENGTH:     Left/Right 09/10/2024 Left/Right 11/?/2025  Hip flexion    Hip extension    Hip abduction    Hip adduction    Hip internal rotation    Hip external rotation    Knee flexion    Knee extension    Ankle dorsiflexion    Ankle plantarflexion    Ankle inversion    Ankle eversion    Lumbar extension (prone bilateral arm & leg extension test) 18 seconds (Goal is 30-60)    (Blank rows = not tested)  GAIT: Distance walked: 100 feet Assistive device utilized: None Level of assistance: Complete Independence Comments: Paula Richard notes she used to walk for exercise but has  not done so in quite some time  TREATMENT DATE:  10/03/2024 Standing lumbar extension AROM 10 x 3 seconds (emphasis on hips forward and hands on gluteals to include the entire lumbar spine)  Prone alternating hip extension 2 sets of 10 for 5 seconds Prone alternating arm and leg extensions 10 x 3 seconds Yoga Bridge 10 for 7 seconds  Functional Activities: Hip hike in doorframe 2 sets of 5 for 3 seconds (posture and gait correction, but not as much as her last visit, needs feedback to avoid knee flexion) Scapular retraction/shoulder blade pinches 10 x 5 seconds (posture) Thera-Band Rows/pull to chest for postural correction Blue 20 x 3 seconds Thera-Band shoulder extension with palms up for scapular positioning and posture Red 10 x 3 seconds Thera-Band shoulder ER Red 10 x each side  02464: Reviewed objective reassessment and made updates to HEP   10/01/2024 Standing lumbar extension AROM 10 x 3 seconds (emphasis on hips forward and hands on gluteals to include the entire lumbar spine),  Prone alternating hip extension 10 for 5 seconds Prone alternating arm and leg extensions 10 x 3 seconds Yoga Bridge 2 sets of 10 for 7 seconds  Functional Activities: Hip hike in doorframe 2 sets of 5 for 3 seconds (posture and gait correction, but not as much as her last visit) Hip hike at countertop 2 sets of 10 for 3 seconds (cues to pick a spot on the wall to avoid leaning side-to-side) Scapular retraction/shoulder blade pinches 10 x 5 seconds (posture) Thera-Band rows/pull to chest for postural correction blue 10 x 3 seconds Thera-Band shoulder extension with palms up for scapular positioning and posture Red 10 x 3 seconds Practical education discussing the importance of taking frequent breaks when driving to avoid prolonged sitting, perhaps reclining or chair for decreased lumbar disc pressure and correct use of a lumbar roll   09/25/2024 Standing lumbar extension AROM 10 x 3 seconds  (emphasis on hips forward and hands on gluteals to include the entire lumbar spine), less about range and more about reps Prone alternating hip extension 2 sets of 10 for 3 seconds, cues to turn sideways on the bed and have knees at the edge of the bed Yoga Bridge 2 sets of 10 for 5 seconds  Functional Activities: Hip hike in doorframe 2 sets of 5 for 3 seconds (posture and gait correction) Hip hike at countertop 2 sets of 10 for 5 seconds (posture and gait correction) Practical logroll for bed mobility; practical golfers lift for taking care of animals and light lifting; diagonal squat lift for heavier lifting like laundry; practical body mechanics with sweeping, vacuuming and  doing dishes   PATIENT EDUCATION:  Education details: See above Person educated: Patient Education method: Explanation, Demonstration, Tactile cues, Verbal cues, and Handouts Education comprehension: verbalized understanding, returned demonstration, verbal cues required, tactile cues required, and needs further education  HOME EXERCISE PROGRAM: Access Code: [email or web address] URL: [email or web address] Date: 10/03/2024 Prepared by: Lamar Ivory  Exercises - Standing Lumbar Extension at Wall - Forearms  - 5 x daily - 7 x weekly - 1 sets - 5 reps - 3 seconds hold - Prone Hip Extension  - 2 x daily - 7 x weekly - 2 sets - 10 reps - 5-10 seconds hold - Yoga Bridge  - 2 x daily - 7 x weekly - 2 sets - 10 reps - 7-10 seconds hold - Standing Hip Hiking  - 5 x daily - 7 x weekly - 1 sets - 5 reps - 5-10 seconds hold - Standing Scapular Retraction  - 5 x daily - 7 x weekly - 1 sets - 5 reps - 5 second hold  ASSESSMENT:  CLINICAL IMPRESSION: Paula Richard continues to have good compliance with her home exercises and home walking program.  She notes progress with her self-reported function since starting physical therapy and is having periods of time with less pain.  Because she is the primary caregiver for her husband  with a disability, she is very physically active and will need to have very good low back strength along with very good body mechanics to continue to do well long-term.  Paula Richard will benefit from the recommended plan of care to meet long-term goals.  OBJECTIVE IMPAIRMENTS: decreased activity tolerance, decreased endurance, decreased knowledge of condition, decreased ROM, decreased strength, decreased safety awareness, improper body mechanics, postural dysfunction, and pain.   ACTIVITY LIMITATIONS: carrying, lifting, bending, sitting, standing, stairs, bed mobility, and locomotion level  PARTICIPATION LIMITATIONS: meal prep, cleaning, laundry, driving, shopping, community activity, and yard work  PERSONAL FACTORS: Anxiety, OA back and knees, peripheral neuropathy, Type 2 DM, HTN, trigger finger releases, previous lumbar surgery are also affecting patient's functional outcome.   REHAB POTENTIAL: Good  CLINICAL DECISION MAKING: Evolving/moderate complexity  EVALUATION COMPLEXITY: Moderate   GOALS: Goals reviewed with patient? Yes  SHORT TERM GOALS: Target date: 10/08/2024  Paula Richard will be independent with her day 1 home exercise program Baseline: Started 09/10/2024 Goal status: Met 10/01/2024  2.  Improve lumbar extension AROM to 15 degrees Baseline: 10 degrees Goal status: INITIAL  3.  Paula Richard will have a better understanding of correct lifting techniques including golfers lift and diagonal squat lift and will be able to implement these into activities around her house and yard Baseline: Poor body mechanics mentioned and observed at evaluation Goal status: Partially met 10/03/2024   LONG TERM GOALS: Target date: 11/05/2024  Improve patient-specific functional score to at least 6.5 Baseline: 3.5 Goal status: Ongoing 10/03/2024  2.  Paula Richard will report low back and bilateral gluteal pain no greater than 3/10 on the numeric pain rating scale Baseline: Can be 8/10 Goal status:  Ongoing 10/03/2024  3.  Paula Richard will be able to maintain the spine strength test position for a minimum of 30 seconds Baseline: 18 seconds Goal status: INITIAL  4.  Paula Richard will be independent with her long-term maintenance home exercise program at discharge Baseline: Started 09/10/2024 Goal status: INITIAL  PLAN:  PT FREQUENCY: 1-2x/week  PT DURATION: 8 weeks  PLANNED INTERVENTIONS: 97750- Physical Performance Testing, 97110-Therapeutic exercises, 97530- Therapeutic activity, [id number]- Neuromuscular re-education, 97535- Self Care, [id number]- Manual therapy, [id number]- Electrical  stimulation (unattended), [id number]- Traction (mechanical), [id number] (1-2 muscles), 20561 (3+ muscles)- Dry Needling, Patient/Family education, Spinal mobilization, and Cryotherapy.  PLAN FOR NEXT SESSION: Continue to progress low back strengthening for lumbar erector spinae, hip abductors and quadratus lumborum.  Practical posture and body mechanics work as Paula Richard is the primary caregiver for her husband with a disability.  Objective measures for lumbar extension AROM and spine strength goal updates.   Myer LELON Ivory, PT, MPT 10/03/2024, 2:44 PM

## 2024-10-08 ENCOUNTER — Encounter: Admitting: Rehabilitative and Restorative Service Providers"

## 2024-10-10 ENCOUNTER — Ambulatory Visit: Admitting: Rehabilitative and Restorative Service Providers"

## 2024-10-10 ENCOUNTER — Encounter: Payer: Self-pay | Admitting: Rehabilitative and Restorative Service Providers"

## 2024-10-10 DIAGNOSIS — M5459 Other low back pain: Secondary | ICD-10-CM | POA: Diagnosis not present

## 2024-10-10 DIAGNOSIS — R293 Abnormal posture: Secondary | ICD-10-CM

## 2024-10-10 DIAGNOSIS — M6281 Muscle weakness (generalized): Secondary | ICD-10-CM | POA: Diagnosis not present

## 2024-10-10 NOTE — Therapy (Signed)
 OUTPATIENT PHYSICAL THERAPY THORACOLUMBAR TREATMENT  Patient Name: Paula Richard MRN: [id number] DOB:[birth year], 73 y.o., female Today's Date: 10/10/2024  END OF SESSION:  PT End of Session - 10/10/24 1304     Visit Number 6    Number of Visits 16    Date for Recertification  11/05/24    PT Start Time 1304    PT Stop Time 1342    PT Time Calculation (min) 38 min    Activity Tolerance Patient tolerated treatment well;No increased pain    Behavior During Therapy WFL for tasks assessed/performed           Past Medical History:  Diagnosis Date   Anxiety    Arthritis    back, knees   Cyst of buttocks 02/2018   right   Dental crowns present    FSGS (focal segmental glomerulosclerosis)    states currently in remission   GERD (gastroesophageal reflux disease)    History of Bell's palsy    History of shingles    Hypertension    states under control with med., has been on med. x 30 yr.   Insulin dependent diabetes mellitus    Neuropathy    feet   Seasonal allergies    Past Surgical History:  Procedure Laterality Date   ABDOMINAL HYSTERECTOMY     complete   CARPAL TUNNEL RELEASE Bilateral    FOOT SURGERY Bilateral    MASS EXCISION Right 03/29/2018   Procedure: EXCISION OF RIGHT BUTTOCK CYST;  Surgeon: Gladis Cough, MD;  Location: [facility];  Service: General;  Laterality: Right;   NASAL SEPTUM SURGERY     STERIOD INJECTION Left 05/23/2017   Procedure: LEFT TRIGGER FINGER INJECTION;  Surgeon: Jerri Kay CHRISTELLA, MD;  Location: [facility];  Service: Orthopedics;  Laterality: Left;   TONSILLECTOMY     TRIGGER FINGER RELEASE Right 05/23/2017   Procedure: RIGHT RING FINGER RELEASE TRIGGER FINGER AND TENOLYSIS, LEFT RING FINGER TRIGGER FINGER INJECTION;  Surgeon: Jerri Kay CHRISTELLA, MD;  Location: [facility];  Service: Orthopedics;  Laterality: Right;   TRIGGER FINGER RELEASE Left 12/26/2017   Procedure: RELEASE TRIGGER FINGER LEFT RING  FINGER;  Surgeon: Jerri Kay CHRISTELLA, MD;  Location: [facility];  Service: Orthopedics;  Laterality: Left;   TRIGGER FINGER RELEASE Left 06/11/2019   Procedure: RELEASE TRIGGER FINGER LEFT MIDDLE FINGER;  Surgeon: Jerri Kay CHRISTELLA, MD;  Location: [facility];  Service: Orthopedics;  Laterality: Left;   Patient Active Problem List   Diagnosis Date Noted   Trigger finger, right middle finger 07/25/2022   Stenosing tenosynovitis of finger of right hand 07/25/2022   Extensor tenosynovitis of left wrist 03/05/2019   Trigger finger, left middle finger 03/05/2019   Hamstring tendinitis of right thigh 03/05/2019   Unilateral primary osteoarthritis, left knee 05/08/2017   Trigger finger, left ring finger 05/08/2017   Trigger finger, right ring finger 10/23/2016   Other chest pain 04/14/2015   Type 2 diabetes mellitus without complication 04/14/2015   Hyperlipidemia 04/14/2015   Essential hypertension 04/14/2015   Epigastric pain 04/14/2015   Palpitations 04/14/2015    PCP: Glendia Freeman, MD  REFERRING PROVIDER: Gerldine Maizes, MD  REFERRING DIAG:  M51.26 (ICD-10-CM) - Other intervertebral disc displacement, lumbar region    Rationale for Evaluation and Treatment: Rehabilitation  THERAPY DIAG:  Abnormal posture  Muscle weakness (generalized)  Other low back pain  ONSET DATE: 4-5 months ago  SUBJECTIVE:  SUBJECTIVE STATEMENT: Tenna notes she has been dizzy today due to vertigo.  She notes pain and functional progress since starting supervised PT.  Low back and right sided radicular symptoms still can be as distal as 1/3 way down the thigh.  PERTINENT HISTORY:  Anxiety, OA back and knees, peripheral neuropathy, Type 2 DM, HTN, trigger finger releases, previous lumbar  surgery  PAIN:  Are you having pain? Yes: NPRS scale: Still 2-4/10 this week Pain location: Mostly right sided but can be bilateral Pain description: Ache, sore (improving), spasm, can be sharp (every few days) Aggravating factors: Carrying anything, prolonged postures and yard work Relieving factors: Lying down  PRECAUTIONS: Back  RED FLAGS: None   WEIGHT BEARING RESTRICTIONS: No  FALLS:  Has patient fallen in last 6 months? Yes. Number of falls 2, will seek more details visit 2  LIVING ENVIRONMENT: Lives with: lives with their family and lives with their spouse Lives in: House/apartment Stairs: Has some trouble with stairs Has following equipment at home: None  OCCUPATION: Retired, doing everything around the house (husband has a disability)  PLOF: Independent  PATIENT GOALS: Be able to manage household chores without being limited by the back  NEXT MD VISIT: NA  OBJECTIVE:  Note: Objective measures were completed at Evaluation unless otherwise noted.  DIAGNOSTIC FINDINGS:  2021 Pre-surgical MRI: 1. At L5-S1 there is a broad-based disc bulge with a right paracentral disc protrusion which contacts and posteriorly displaces the right S1 nerve root. Mild bilateral foraminal narrowing. 2. At L4-5 there is a broad-based disc osteophyte complex with a small broad central disc protrusion. Mild bilateral facet arthropathy. Bilateral subarticular recess stenosis. Mild spinal stenosis. Mild bilateral foraminal narrowing. 3. Lumbar spine spondylosis as described above.  PATIENT SURVEYS:  PSFS: THE PATIENT SPECIFIC FUNCTIONAL SCALE  Place score of 0-10 (0 = unable to perform activity and 10 = able to perform activity at the same level as before injury or problem)  Activity Date: 09/10/2024 10/03/2024   Oralia work 3 4   2.  Take care of outside animals 7 8   3.  Sitting 2 5   4.  Standing 2 7   Total Score 3.5 6     Total Score = Sum of activity scores/number of  activities  Minimally Detectable Change: 3 points (for single activity); 2 points (for average score)  [name] (nd). The Patient Specific Functional Scale . Retrieved from [email or web address]   COGNITION: Overall cognitive status: Within functional limits for tasks assessed     SENSATION: Danicia notes no peripheral pain or paresthesias below the gluteal folds.  She has low back and at times gluteal pain.  MUSCLE LENGTH: 10/03/2024: Hamstrings: Right 50 deg; Left 45 deg  Evaluation: Hamstrings: Right 45 deg; Left 45 deg  POSTURE: Good posture when standing and Kayla understands the importance of good posture.  However, her body mechanics as described with yard work and other activities around the house will need to improve.  LUMBAR ROM:   AROM 09/10/2024 10/10/2024  Flexion    Extension 10 15  Right lateral flexion    Left lateral flexion    Right rotation    Left rotation     (Blank rows = not tested)  LOWER EXTREMITY ROM:     Passive  Left/Right 09/10/2024 Left/Right 10/03/2024  Hip flexion 90/90 95/100  Hip extension    Hip abduction    Hip adduction    Hip internal rotation 3/14 12/12  Hip external  rotation 38/36 36/38  Knee flexion    Knee extension    Ankle dorsiflexion    Ankle plantarflexion    Ankle inversion    Ankle eversion     (Blank rows = not tested)  STRENGTH:     Left/Right 09/10/2024 Left/Right 10/10/2024  Hip flexion    Hip extension    Hip abduction    Hip adduction    Hip internal rotation    Hip external rotation    Knee flexion    Knee extension    Ankle dorsiflexion    Ankle plantarflexion    Ankle inversion    Ankle eversion    Lumbar extension (prone bilateral arm & leg extension test) 18 seconds (Goal is 30-60) 40 seconds   (Blank rows = not tested)  GAIT: Distance walked: 100 feet Assistive device utilized: None Level of assistance: Complete  Independence Comments: Ahria notes she used to walk for exercise but has not done so in quite some time  TREATMENT DATE:  10/03/2024 Standing lumbar extension AROM 10 x 3 seconds (emphasis on hips forward, hands on gluteals and staying within the comfortable range of motion)  Prone alternating hip extension 10 for 5 seconds Prone alternating arm and leg extensions 2 sets of 10 x 3 seconds  Functional Activities: Hip hike in doorframe 2 sets of 5 for 3 seconds (better posture as compared to previous visits, consider adding the press next visit) Scapular retraction/shoulder blade pinches 10 x 5 seconds (posture)  [id number]: Practical body mechanics for golfers and diagonal squat lifts; 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 Reviewed objective reassessment and made updates to HEP   10/01/2024 Standing lumbar extension AROM 10 x 3 seconds (emphasis on hips forward and hands on gluteals to include the entire lumbar spine),  Prone alternating hip extension 10 for 5 seconds Prone alternating arm and leg extensions 10 x 3 seconds Yoga Bridge 2 sets of 10 for 7 seconds  Functional Activities: Hip hike in doorframe 2 sets of 5 for 3 seconds (posture and gait correction, but not as much as her last visit) Hip hike at countertop 2 sets of 10 for 3 seconds (cues to pick a spot on the wall to avoid leaning side-to-side) Scapular retraction/shoulder blade pinches 10 x 5 seconds (posture) Thera-Band rows/pull to chest for postural correction blue 10 x 3 seconds Thera-Band shoulder extension with palms up for scapular positioning and posture Red 10 x 3 seconds Practical education discussing the importance of taking frequent breaks when driving to avoid prolonged sitting, perhaps reclining or chair for decreased lumbar disc pressure and correct use of a lumbar roll   09/25/2024 Standing lumbar extension AROM 10 x 3 seconds (emphasis on hips forward and hands on  gluteals to include the entire lumbar spine), less about range and more about reps Prone alternating hip extension 2 sets of 10 for 3 seconds, cues to turn sideways on the bed and have knees at the edge of the bed Yoga Bridge 2 sets of 10 for 5 seconds  Functional Activities: Hip hike in doorframe 2 sets of 5 for 3 seconds (posture and gait correction) Hip hike at countertop 2 sets of 10 for 5 seconds (posture and gait correction) Practical logroll for bed mobility; practical golfers lift for taking care of animals and light lifting; diagonal squat lift for heavier lifting like laundry; practical body mechanics with sweeping, vacuuming and doing dishes   PATIENT EDUCATION:  Education details:  See above Person educated: Patient Education method: Explanation, Demonstration, Tactile cues, Verbal cues, and Handouts Education comprehension: verbalized understanding, returned demonstration, verbal cues required, tactile cues required, and needs further education  HOME EXERCISE PROGRAM: Access Code: [email or web address] URL: [email or web address] Date: 10/10/2024 Prepared by: Lamar Ivory  Exercises - Standing Lumbar Extension at Wall - Forearms  - 5 x daily - 7 x weekly - 1 sets - 5 reps - 3 seconds hold - Prone Hip Extension  - 1 x daily - 7 x weekly - 1 sets - 10 reps - 5-10 seconds hold - Yoga Bridge  - 1 x daily - 1 x weekly - 2 sets - 10 reps - 7-10 seconds hold - Standing Hip Hiking  - 5 x daily - 7 x weekly - 1 sets - 5 reps - 5-10 seconds hold - Standing Scapular Retraction  - 5 x daily - 7 x weekly - 1 sets - 5 reps - 5 second hold - Prone Alternating Arm and Leg Lifts  - 1 x daily - 7 x weekly - 2 sets - 10 reps - 3-10 seconds hold  ASSESSMENT:  CLINICAL IMPRESSION: Shatiqua notes symptoms continue to improve as compared to evaluation.  She reports making effort to implement correct posture and body mechanics into her daily activities, although she did require some correction  and cueing for practical work today.  Bessy is making good progress towards long-term goals and will benefit from remaining visits to prepare her for independent rehabilitation.  OBJECTIVE IMPAIRMENTS: decreased activity tolerance, decreased endurance, decreased knowledge of condition, decreased ROM, decreased strength, decreased safety awareness, improper body mechanics, postural dysfunction, and pain.   ACTIVITY LIMITATIONS: carrying, lifting, bending, sitting, standing, stairs, bed mobility, and locomotion level  PARTICIPATION LIMITATIONS: meal prep, cleaning, laundry, driving, shopping, community activity, and yard work  PERSONAL FACTORS: Anxiety, OA back and knees, peripheral neuropathy, Type 2 DM, HTN, trigger finger releases, previous lumbar surgery are also affecting patient's functional outcome.   REHAB POTENTIAL: Good  CLINICAL DECISION MAKING: Evolving/moderate complexity  EVALUATION COMPLEXITY: Moderate   GOALS: Goals reviewed with patient? Yes  SHORT TERM GOALS: Target date: 10/08/2024  Niki will be independent with her day 1 home exercise program Baseline: Started 09/10/2024 Goal status: Met 10/01/2024  2.  Improve lumbar extension AROM to 15 degrees Baseline: 10 degrees Goal status: Met 10/10/2024  3.  Arline will have a better understanding of correct lifting techniques including golfers lift and diagonal squat lift and will be able to implement these into activities around her house and yard Baseline: Poor body mechanics mentioned and observed at evaluation Goal status: Met 10/10/2024   LONG TERM GOALS: Target date: 11/05/2024  Improve patient-specific functional score to at least 6.5 Baseline: 3.5 Goal status: Ongoing 10/03/2024  2.  Zhania will report low back and bilateral gluteal pain no greater than 3/10 on the numeric pain rating scale Baseline: Can be 8/10 Goal status: Ongoing 10/10/2024  3.  Jini will be able to maintain the spine  strength test position for a minimum of 30 seconds Baseline: 18 seconds Goal status: Met 10/10/2024  4.  Amarilis will be independent with her long-term maintenance home exercise program at discharge Baseline: Started 09/10/2024 Goal status: INITIAL  PLAN:  PT FREQUENCY: 1-2x/week  PT DURATION: 8 weeks  PLANNED INTERVENTIONS: 97750- Physical Performance Testing, 97110-Therapeutic exercises, 97530- Therapeutic activity, [id number]- Neuromuscular re-education, 97535- Self Care, [id number]- Manual therapy, [id number]- Electrical stimulation (unattended), 02987- Traction (mechanical), 20560 (1-2 muscles), 20561 (3+ muscles)- Dry  Needling, Patient/Family education, Spinal mobilization, and Cryotherapy.  PLAN FOR NEXT SESSION: Continue to progress low back strengthening for lumbar erector spinae, hip abductors and quadratus lumborum.  Practical posture and body mechanics work as Janayia is the primary caregiver for her husband with a disability.  Prepare for transfer into independent rehabilitation.  Myer LELON Ivory, PT, MPT 10/10/2024, 2:50 PM

## 2024-10-15 ENCOUNTER — Ambulatory Visit: Admitting: Rehabilitative and Restorative Service Providers"

## 2024-10-15 ENCOUNTER — Encounter: Payer: Self-pay | Admitting: Rehabilitative and Restorative Service Providers"

## 2024-10-15 DIAGNOSIS — M6281 Muscle weakness (generalized): Secondary | ICD-10-CM | POA: Diagnosis not present

## 2024-10-15 DIAGNOSIS — R293 Abnormal posture: Secondary | ICD-10-CM | POA: Diagnosis not present

## 2024-10-15 DIAGNOSIS — M5459 Other low back pain: Secondary | ICD-10-CM

## 2024-10-15 NOTE — Therapy (Signed)
 OUTPATIENT PHYSICAL THERAPY THORACOLUMBAR TREATMENT  Patient Name: Paula Richard MRN: [id number] DOB:1951/02/14, 73 y.o., female Today's Date: 10/15/2024  END OF SESSION:  PT End of Session - 10/15/24 1438     Visit Number 7    Number of Visits 16    Date for Recertification  11/05/24    PT Start Time 1437    PT Stop Time 1517    PT Time Calculation (min) 40 min    Activity Tolerance Patient tolerated treatment well;No increased pain    Behavior During Therapy WFL for tasks assessed/performed            Past Medical History:  Diagnosis Date   Anxiety    Arthritis    back, knees   Cyst of buttocks 02/2018   right   Dental crowns present    FSGS (focal segmental glomerulosclerosis)    states currently in remission   GERD (gastroesophageal reflux disease)    History of Bell's palsy    History of shingles    Hypertension    states under control with med., has been on med. x 30 yr.   Insulin dependent diabetes mellitus    Neuropathy    feet   Seasonal allergies    Past Surgical History:  Procedure Laterality Date   ABDOMINAL HYSTERECTOMY     complete   CARPAL TUNNEL RELEASE Bilateral    FOOT SURGERY Bilateral    MASS EXCISION Right 03/29/2018   Procedure: EXCISION OF RIGHT BUTTOCK CYST;  Surgeon: Gladis Cough, MD;  Location: [facility];  Service: General;  Laterality: Right;   NASAL SEPTUM SURGERY     STERIOD INJECTION Left 05/23/2017   Procedure: LEFT TRIGGER FINGER INJECTION;  Surgeon: Jerri Kay CHRISTELLA, MD;  Location: [facility];  Service: Orthopedics;  Laterality: Left;   TONSILLECTOMY     TRIGGER FINGER RELEASE Right 05/23/2017   Procedure: RIGHT RING FINGER RELEASE TRIGGER FINGER AND TENOLYSIS, LEFT RING FINGER TRIGGER FINGER INJECTION;  Surgeon: Jerri Kay CHRISTELLA, MD;  Location: [facility];  Service: Orthopedics;  Laterality: Right;   TRIGGER FINGER RELEASE Left 12/26/2017   Procedure: RELEASE TRIGGER FINGER LEFT  RING FINGER;  Surgeon: Jerri Kay CHRISTELLA, MD;  Location: [facility];  Service: Orthopedics;  Laterality: Left;   TRIGGER FINGER RELEASE Left 06/11/2019   Procedure: RELEASE TRIGGER FINGER LEFT MIDDLE FINGER;  Surgeon: Jerri Kay CHRISTELLA, MD;  Location: [facility];  Service: Orthopedics;  Laterality: Left;   Patient Active Problem List   Diagnosis Date Noted   Trigger finger, right middle finger 07/25/2022   Stenosing tenosynovitis of finger of right hand 07/25/2022   Extensor tenosynovitis of left wrist 03/05/2019   Trigger finger, left middle finger 03/05/2019   Hamstring tendinitis of right thigh 03/05/2019   Unilateral primary osteoarthritis, left knee 05/08/2017   Trigger finger, left ring finger 05/08/2017   Trigger finger, right ring finger 10/23/2016   Other chest pain 04/14/2015   Type 2 diabetes mellitus without complication 04/14/2015   Hyperlipidemia 04/14/2015   Essential hypertension 04/14/2015   Epigastric pain 04/14/2015   Palpitations 04/14/2015    PCP: Glendia Freeman, MD  REFERRING PROVIDER: Gerldine Maizes, MD  REFERRING DIAG:  M51.26 (ICD-10-CM) - Other intervertebral disc displacement, lumbar region    Rationale for Evaluation and Treatment: Rehabilitation  THERAPY DIAG:  Abnormal posture  Muscle weakness (generalized)  Other low back pain  ONSET DATE: 4-5 months ago  SUBJECTIVE:  SUBJECTIVE STATEMENT: Paula Richard notes she continues to be busy with doctor and hospital visits with her husband.  Less dizzy today.  She notes continued functional progress since starting supervised PT.  Low back and right sided radicular symptoms still have improved, but can be as distal as 1/3 way down the thigh.  PERTINENT HISTORY:  Anxiety, OA back and knees,  peripheral neuropathy, Type 2 DM, HTN, trigger finger releases, previous lumbar surgery  PAIN:  Are you having pain? Yes: NPRS scale: Still 2-4/10 this week Pain location: Mostly right sided but can be bilateral Pain description: Ache, sore (improving), spasm, can be sharp (every few days) Aggravating factors: Carrying anything, prolonged postures and yard work Relieving factors: Lying down  PRECAUTIONS: Back  RED FLAGS: None   WEIGHT BEARING RESTRICTIONS: No  FALLS:  Has patient fallen in last 6 months? Yes. Number of falls 2, will seek more details visit 2  LIVING ENVIRONMENT: Lives with: lives with their family and lives with their spouse Lives in: House/apartment Stairs: Has some trouble with stairs Has following equipment at home: None  OCCUPATION: Retired, doing everything around the house (husband has a disability)  PLOF: Independent  PATIENT GOALS: Be able to manage household chores without being limited by the back  NEXT MD VISIT: NA  OBJECTIVE:  Note: Objective measures were completed at Evaluation unless otherwise noted.  DIAGNOSTIC FINDINGS:  2021 Pre-surgical MRI: 1. At L5-S1 there is a broad-based disc bulge with a right paracentral disc protrusion which contacts and posteriorly displaces the right S1 nerve root. Mild bilateral foraminal narrowing. 2. At L4-5 there is a broad-based disc osteophyte complex with a small broad central disc protrusion. Mild bilateral facet arthropathy. Bilateral subarticular recess stenosis. Mild spinal stenosis. Mild bilateral foraminal narrowing. 3. Lumbar spine spondylosis as described above.  PATIENT SURVEYS:  PSFS: THE PATIENT SPECIFIC FUNCTIONAL SCALE  Place score of 0-10 (0 = unable to perform activity and 10 = able to perform activity at the same level as before injury or problem)  Activity Date: 09/10/2024 10/03/2024   Oralia work 3 4   2.  Take care of outside animals 7 8   3.  Sitting 2 5   4.  Standing 2 7    Total Score 3.5 6     Total Score = Sum of activity scores/number of activities  Minimally Detectable Change: 3 points (for single activity); 2 points (for average score)  [facility] (nd). The Patient Specific Functional Scale . Retrieved from [email or web address]   COGNITION: Overall cognitive status: Within functional limits for tasks assessed     SENSATION: Paula Richard notes no peripheral pain or paresthesias below the gluteal folds.  She has low back and at times gluteal pain.  MUSCLE LENGTH: 10/15/2024: Hamstrings: Right 55 deg; Left 55 deg  10/03/2024: Hamstrings: Right 50 deg; Left 45 deg  Evaluation: Hamstrings: Right 45 deg; Left 45 deg  POSTURE: Good posture when standing and Paula Richard understands the importance of good posture.  However, her body mechanics as described with yard work and other activities around the house will need to improve.  LUMBAR ROM:   AROM 09/10/2024 10/10/2024  Flexion    Extension 10 15  Right lateral flexion    Left lateral flexion    Right rotation    Left rotation     (Blank rows = not tested)  LOWER EXTREMITY ROM:     Passive  Left/Right 09/10/2024 Left/Right 10/03/2024 Left/Right 10/15/2024  Hip flexion 90/90 95/100 100/100  Hip extension     Hip abduction     Hip adduction     Hip internal rotation 3/14 12/12 14/12  Hip external rotation 38/36 36/38 34/28   Knee flexion     Knee extension     Ankle dorsiflexion     Ankle plantarflexion     Ankle inversion     Ankle eversion      (Blank rows = not tested)  STRENGTH:     Left/Right 09/10/2024 Left/Right 10/10/2024  Hip flexion    Hip extension    Hip abduction    Hip adduction    Hip internal rotation    Hip external rotation    Knee flexion    Knee extension    Ankle dorsiflexion    Ankle plantarflexion    Ankle inversion    Ankle eversion    Lumbar extension (prone bilateral arm & leg extension test)  18 seconds (Goal is 30-60) 40 seconds   (Blank rows = not tested)  GAIT: Distance walked: 100 feet Assistive device utilized: None Level of assistance: Complete Independence Comments: Tiphany notes she used to walk for exercise but has not done so in quite some time  TREATMENT DATE:  10/15/2024 Standing lumbar extension AROM 5 x 3 seconds (emphasis on hips forward, hands on lower gluteals and staying within the comfortable range of motion)  Prone alternating hip extension 10 for 5 seconds Prone alternating arm and leg extensions 2 sets of 10 x 3 seconds Figure 4 stretch 4 x 20 seconds each side  Functional Activities: Hip hike in doorframe 2 sets of 5 for 3 seconds (posture is better) Hip hike, IR and press 5 x each side 3 seconds (needs work to avoid knee hyper-extension, not ready for HEP) Scapular retraction/shoulder blade pinches 5 x 5 seconds (posture) Thera-Band rows/pull to chest for postural correction blue 20 x 3 seconds Thera-Band shoulder extension with palms up for scapular positioning and posture Red 10 x 3 seconds Practical body mechanics for golfers and 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; discussed other farm related chores like raking   97535: Reviewed today's objective reassessment    10/03/2024 Standing lumbar extension AROM 10 x 3 seconds (emphasis on hips forward, hands on gluteals and staying within the comfortable range of motion)  Prone alternating hip extension 10 for 5 seconds Prone alternating arm and leg extensions 2 sets of 10 x 3 seconds  Functional Activities: Hip hike in doorframe 2 sets of 5 for 3 seconds (better posture as compared to previous visits, consider adding the press next visit) Scapular retraction/shoulder blade pinches 10 x 5 seconds (posture)  97535: Practical body mechanics for golfers and diagonal squat lifts; 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 Reviewed objective  reassessment and made updates to HEP   10/01/2024 Standing lumbar extension AROM 10 x 3 seconds (emphasis on hips forward and hands on gluteals to include the entire lumbar spine),  Prone alternating hip extension 10 for 5 seconds Prone alternating arm and leg extensions 10 x 3 seconds Yoga Bridge 2 sets of 10 for 7 seconds  Functional Activities: Hip hike in doorframe 2 sets of 5 for 3 seconds (posture and gait correction, but not as much as her last visit) Hip hike at countertop 2 sets of 10 for 3 seconds (cues to pick a spot on the wall to avoid leaning side-to-side) Scapular retraction/shoulder blade pinches 10 x 5 seconds (posture) Thera-Band rows/pull  to chest for postural correction blue 10 x 3 seconds Thera-Band shoulder extension with palms up for scapular positioning and posture Red 10 x 3 seconds Practical education discussing the importance of taking frequent breaks when driving to avoid prolonged sitting, perhaps reclining or chair for decreased lumbar disc pressure and correct use of a lumbar roll   PATIENT EDUCATION:  Education details: See above Person educated: Patient Education method: Explanation, Demonstration, Tactile cues, Verbal cues, and Handouts Education comprehension: verbalized understanding, returned demonstration, verbal cues required, tactile cues required, and needs further education  HOME EXERCISE PROGRAM: Access Code: [email or web address] URL: [email or web address] Date: 10/10/2024 Prepared by: Lamar Ivory  Exercises - Standing Lumbar Extension at Wall - Forearms  - 5 x daily - 7 x weekly - 1 sets - 5 reps - 3 seconds hold - Prone Hip Extension  - 1 x daily - 7 x weekly - 1 sets - 10 reps - 5-10 seconds hold - Yoga Bridge  - 1 x daily - 1 x weekly - 2 sets - 10 reps - 7-10 seconds hold - Standing Hip Hiking  - 5 x daily - 7 x weekly - 1 sets - 5 reps - 5-10 seconds hold - Standing Scapular Retraction  - 5 x daily - 7 x weekly - 1 sets - 5 reps -  5 second hold - Prone Alternating Arm and Leg Lifts  - 1 x daily - 7 x weekly - 2 sets - 10 reps - 3-10 seconds hold  ASSESSMENT:  CLINICAL IMPRESSION: Paula Richard notes overall progress as compared to evaluation.  She has been very busy helping care for her husband so not as much progress over the past week, although less feedback is needed with her HEP and practical body mechanics.  Paula Richard will continue to benefit from remaining visits to prepare her for independent rehabilitation.  OBJECTIVE IMPAIRMENTS: decreased activity tolerance, decreased endurance, decreased knowledge of condition, decreased ROM, decreased strength, decreased safety awareness, improper body mechanics, postural dysfunction, and pain.   ACTIVITY LIMITATIONS: carrying, lifting, bending, sitting, standing, stairs, bed mobility, and locomotion level  PARTICIPATION LIMITATIONS: meal prep, cleaning, laundry, driving, shopping, community activity, and yard work  PERSONAL FACTORS: Anxiety, OA back and knees, peripheral neuropathy, Type 2 DM, HTN, trigger finger releases, previous lumbar surgery are also affecting patient's functional outcome.   REHAB POTENTIAL: Good  CLINICAL DECISION MAKING: Evolving/moderate complexity  EVALUATION COMPLEXITY: Moderate   GOALS: Goals reviewed with patient? Yes  SHORT TERM GOALS: Target date: 10/08/2024  Paula Richard will be independent with her day 1 home exercise program Baseline: Started 09/10/2024 Goal status: Met 10/01/2024  2.  Improve lumbar extension AROM to 15 degrees Baseline: 10 degrees Goal status: Met 10/10/2024  3.  Paula Richard will have a better understanding of correct lifting techniques including golfers lift and diagonal squat lift and will be able to implement these into activities around her house and yard Baseline: Poor body mechanics mentioned and observed at evaluation Goal status: Met 10/10/2024   LONG TERM GOALS: Target date: 11/05/2024  Improve  patient-specific functional score to at least 6.5 Baseline: 3.5 Goal status: Ongoing 10/03/2024  2.  Paula Richard will report low back and bilateral gluteal pain no greater than 3/10 on the numeric pain rating scale Baseline: Can be 8/10 Goal status: Ongoing 10/15/2024  3.  Paula Richard will be able to maintain the spine strength test position for a minimum of 30 seconds Baseline: 18 seconds Goal status: Met 10/10/2024  4.  Paula Richard will be independent with  her long-term maintenance home exercise program at discharge Baseline: Started 09/10/2024 Goal status: INITIAL  PLAN:  PT FREQUENCY: 1-2x/week  PT DURATION: 8 weeks  PLANNED INTERVENTIONS: 97750- Physical Performance Testing, 97110-Therapeutic exercises, 97530- Therapeutic activity, 97112- Neuromuscular re-education, 97535- Self Care, [id number]- Manual therapy, [id number]- Electrical stimulation (unattended), 02987- Traction (mechanical), 20560 (1-2 muscles), 20561 (3+ muscles)- Dry Needling, Patient/Family education, Spinal mobilization, and Cryotherapy.  PLAN FOR NEXT SESSION: Continue to progress low back strengthening for lumbar erector spinae, hip abductors and quadratus lumborum.  Practical posture and body mechanics work as Paula Richard is the primary caregiver for her husband with a disability.  Prepare for transfer into independent rehabilitation.  Myer LELON Ivory, PT, MPT 10/15/2024, 7:13 PM

## 2024-10-17 ENCOUNTER — Encounter: Payer: Self-pay | Admitting: Rehabilitative and Restorative Service Providers"

## 2024-10-17 ENCOUNTER — Ambulatory Visit: Admitting: Rehabilitative and Restorative Service Providers"

## 2024-10-17 DIAGNOSIS — M5459 Other low back pain: Secondary | ICD-10-CM

## 2024-10-17 DIAGNOSIS — M6281 Muscle weakness (generalized): Secondary | ICD-10-CM | POA: Diagnosis not present

## 2024-10-17 DIAGNOSIS — R293 Abnormal posture: Secondary | ICD-10-CM | POA: Diagnosis not present

## 2024-10-17 NOTE — Therapy (Signed)
 OUTPATIENT PHYSICAL THERAPY THORACOLUMBAR TREATMENT  Patient Name: Paula Richard MRN: [id number] DOB:1951-02-19, 73 y.o., female Today's Date: 10/17/2024  END OF SESSION:  PT End of Session - 10/17/24 1306     Visit Number 8    Number of Visits 16    Date for Recertification  11/05/24    PT Start Time 1305    PT Stop Time 1347    PT Time Calculation (min) 42 min    Activity Tolerance Patient tolerated treatment well;No increased pain    Behavior During Therapy WFL for tasks assessed/performed             Past Medical History:  Diagnosis Date   Anxiety    Arthritis    back, knees   Cyst of buttocks 02/2018   right   Dental crowns present    FSGS (focal segmental glomerulosclerosis)    states currently in remission   GERD (gastroesophageal reflux disease)    History of Bell's palsy    History of shingles    Hypertension    states under control with med., has been on med. x 30 yr.   Insulin dependent diabetes mellitus    Neuropathy    feet   Seasonal allergies    Past Surgical History:  Procedure Laterality Date   ABDOMINAL HYSTERECTOMY     complete   CARPAL TUNNEL RELEASE Bilateral    FOOT SURGERY Bilateral    MASS EXCISION Right 03/29/2018   Procedure: EXCISION OF RIGHT BUTTOCK CYST;  Surgeon: Gladis Cough, MD;  Location: [facility];  Service: General;  Laterality: Right;   NASAL SEPTUM SURGERY     STERIOD INJECTION Left 05/23/2017   Procedure: LEFT TRIGGER FINGER INJECTION;  Surgeon: Jerri Kay CHRISTELLA, MD;  Location: [facility];  Service: Orthopedics;  Laterality: Left;   TONSILLECTOMY     TRIGGER FINGER RELEASE Right 05/23/2017   Procedure: RIGHT RING FINGER RELEASE TRIGGER FINGER AND TENOLYSIS, LEFT RING FINGER TRIGGER FINGER INJECTION;  Surgeon: Jerri Kay CHRISTELLA, MD;  Location: [facility];  Service: Orthopedics;  Laterality: Right;   TRIGGER FINGER RELEASE Left 12/26/2017   Procedure: RELEASE TRIGGER FINGER LEFT  RING FINGER;  Surgeon: Jerri Kay CHRISTELLA, MD;  Location: [facility];  Service: Orthopedics;  Laterality: Left;   TRIGGER FINGER RELEASE Left 06/11/2019   Procedure: RELEASE TRIGGER FINGER LEFT MIDDLE FINGER;  Surgeon: Jerri Kay CHRISTELLA, MD;  Location: [facility];  Service: Orthopedics;  Laterality: Left;   Patient Active Problem List   Diagnosis Date Noted   Trigger finger, right middle finger 07/25/2022   Stenosing tenosynovitis of finger of right hand 07/25/2022   Extensor tenosynovitis of left wrist 03/05/2019   Trigger finger, left middle finger 03/05/2019   Hamstring tendinitis of right thigh 03/05/2019   Unilateral primary osteoarthritis, left knee 05/08/2017   Trigger finger, left ring finger 05/08/2017   Trigger finger, right ring finger 10/23/2016   Other chest pain 04/14/2015   Type 2 diabetes mellitus without complication 04/14/2015   Hyperlipidemia 04/14/2015   Essential hypertension 04/14/2015   Epigastric pain 04/14/2015   Palpitations 04/14/2015    PCP: Glendia Freeman, MD  REFERRING PROVIDER: Gerldine Maizes, MD  REFERRING DIAG:  M51.26 (ICD-10-CM) - Other intervertebral disc displacement, lumbar region    Rationale for Evaluation and Treatment: Rehabilitation  THERAPY DIAG:  Abnormal posture  Muscle weakness (generalized)  Other low back pain  ONSET DATE: 4-5 months ago  SUBJECTIVE:  SUBJECTIVE STATEMENT: Paula Richard notes continued functional progress since starting supervised PT.  Low back and right sided radicular symptoms are slightly elevated today with the rain, but again improved since evaluation.  PERTINENT HISTORY:  Anxiety, OA back and knees, peripheral neuropathy, Type 2 DM, HTN, trigger finger releases, previous lumbar surgery  PAIN:  Are  you having pain? Yes: NPRS scale: 3-4/10 the past 3 days Pain location: Mostly right sided but can be bilateral Pain description: Ache, sore (improving), spasm, can be sharp (every few days) Aggravating factors: Carrying anything, prolonged postures, foul weather and yard Richard Relieving factors: Lying down  PRECAUTIONS: Back  RED FLAGS: None   WEIGHT BEARING RESTRICTIONS: No  FALLS:  Has patient fallen in last 6 months? Yes. Number of falls 2, will seek more details visit 2  LIVING ENVIRONMENT: Lives with: lives with their family and lives with their spouse Lives in: House/apartment Stairs: Has some trouble with stairs Has following equipment at home: None  OCCUPATION: Retired, doing everything around the house (husband has a disability)  PLOF: Independent  PATIENT GOALS: Be able to manage household chores without being limited by the back  NEXT MD VISIT: NA  OBJECTIVE:  Note: Objective measures were completed at Evaluation unless otherwise noted.  DIAGNOSTIC FINDINGS:  2021 Pre-surgical MRI: 1. At L5-S1 there is a broad-based disc bulge with a right paracentral disc protrusion which contacts and posteriorly displaces the right S1 nerve root. Mild bilateral foraminal narrowing. 2. At L4-5 there is a broad-based disc osteophyte complex with a small broad central disc protrusion. Mild bilateral facet arthropathy. Bilateral subarticular recess stenosis. Mild spinal stenosis. Mild bilateral foraminal narrowing. 3. Lumbar spine spondylosis as described above.  PATIENT SURVEYS:  PSFS: THE PATIENT SPECIFIC FUNCTIONAL SCALE  Place score of 0-10 (0 = unable to perform activity and 10 = able to perform activity at the same level as before injury or problem)  Activity Date: 09/10/2024 10/03/2024   Paula Richard 3 4   2.  Take care of outside animals 7 8   3.  Sitting 2 5   4.  Standing 2 7   Total Score 3.5 6     Total Score = Sum of activity scores/number of  activities  Minimally Detectable Change: 3 points (for single activity); 2 points (for average score)  [facility] (nd). The Patient Specific Functional Scale . Retrieved from [email or web address]   COGNITION: Overall cognitive status: Within functional limits for tasks assessed     SENSATION: Paula Richard notes no peripheral pain or paresthesias below the gluteal folds.  She has low back and at times gluteal pain.  MUSCLE LENGTH: 10/15/2024: Hamstrings: Right 55 deg; Left 55 deg  10/03/2024: Hamstrings: Right 50 deg; Left 45 deg  Evaluation: Hamstrings: Right 45 deg; Left 45 deg  POSTURE: Good posture when standing and Paula Richard understands the importance of good posture.  However, her body mechanics as described with yard Richard and other activities around the house will need to improve.  LUMBAR ROM:   AROM 09/10/2024 10/10/2024  Flexion    Extension 10 15  Right lateral flexion    Left lateral flexion    Right rotation    Left rotation     (Blank rows = not tested)  LOWER EXTREMITY ROM:     Passive  Left/Right 09/10/2024 Left/Right 10/03/2024 Left/Right 10/15/2024  Hip flexion 90/90 95/100 100/100  Hip extension     Hip abduction     Hip adduction     Hip  internal rotation 3/14 12/12 14/12  Hip external rotation 38/36 36/38 34/28   Knee flexion     Knee extension     Ankle dorsiflexion     Ankle plantarflexion     Ankle inversion     Ankle eversion      (Blank rows = not tested)  STRENGTH:     Left/Right 09/10/2024 Left/Right 10/10/2024  Hip flexion    Hip extension    Hip abduction    Hip adduction    Hip internal rotation    Hip external rotation    Knee flexion    Knee extension    Ankle dorsiflexion    Ankle plantarflexion    Ankle inversion    Ankle eversion    Lumbar extension (prone bilateral arm & leg extension test) 18 seconds (Goal is 30-60) 40 seconds   (Blank rows = not  tested)  GAIT: Distance walked: 100 feet Assistive device utilized: None Level of assistance: Complete Independence Comments: Paula Richard notes she used to walk for exercise but has not done so in quite some time  TREATMENT DATE:  10/17/2024 Standing lumbar extension AROM 10 x 3 seconds (emphasis on hips forward, hands on lower gluteals and staying within the comfortable range of motion)  Prone alternating hip extension 10 for 5 seconds Prone alternating arm and leg extensions 10 x 3 seconds (hold next visit, vertigo) Figure 4 stretch 4 x 20 seconds each side Side-lie left and lift right clams and Black Thera-Band 15 x 3 seconds, slow eccentrics  Functional Activities: Hip hike in doorframe 2 sets of 5 for 3 seconds (posture is better) Hip hike, IR and press 5 x each side 3 seconds (needs Richard to avoid knee hyper-extension, not ready for HEP) Scapular retraction/shoulder blade pinches 10 x 5 seconds (posture) Thera-Band rows/pull to chest for postural correction blue 20 x 3 seconds Thera-Band shoulder extension with palms up for scapular positioning and posture Red 10 x 3 seconds Thera-Band shoulder ER Red 10 x 3 seconds, slow eccentrics each side (posture/scapular retraction Practical body mechanics for golfers and 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; discussed other farm related chores like raking    10/15/2024 Standing lumbar extension AROM 5 x 3 seconds (emphasis on hips forward, hands on lower gluteals and staying within the comfortable range of motion)  Prone alternating hip extension 10 for 5 seconds Prone alternating arm and leg extensions 2 sets of 10 x 3 seconds Figure 4 stretch 4 x 20 seconds each side  Functional Activities: Hip hike in doorframe 2 sets of 5 for 3 seconds (posture is better) Hip hike, IR and press 5 x each side 3 seconds (needs Richard to avoid knee hyper-extension, not ready for HEP) Scapular retraction/shoulder blade pinches 5 x 5  seconds (posture) Thera-Band rows/pull to chest for postural correction blue 20 x 3 seconds Thera-Band shoulder extension with palms up for scapular positioning and posture Red 10 x 3 seconds Practical body mechanics for golfers and 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; discussed other farm related chores like raking   97535: Reviewed today's objective reassessment    10/03/2024 Standing lumbar extension AROM 10 x 3 seconds (emphasis on hips forward, hands on gluteals and staying within the comfortable range of motion)  Prone alternating hip extension 10 for 5 seconds Prone alternating arm and leg extensions 2 sets of 10 x 3 seconds  Functional Activities: Hip hike in doorframe 2 sets of 5 for 3  seconds (better posture as compared to previous visits, consider adding the press next visit) Scapular retraction/shoulder blade pinches 10 x 5 seconds (posture)  97535: Practical body mechanics for golfers and diagonal squat lifts; 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 Reviewed objective reassessment and made updates to HEP   PATIENT EDUCATION:  Education details: See above Person educated: Patient Education method: Explanation, Demonstration, Tactile cues, Verbal cues, and Handouts Education comprehension: verbalized understanding, returned demonstration, verbal cues required, tactile cues required, and needs further education  HOME EXERCISE PROGRAM: Access Code: [email or web address] URL: [email or web address] Date: 10/17/2024 Prepared by: Lamar Richard  Exercises - Standing Lumbar Extension at Wall - Forearms  - 5 x daily - 7 x weekly - 1 sets - 5 reps - 3 seconds hold - Prone Hip Extension  - 1 x daily - 7 x weekly - 2 sets - 10 reps - 5-10 seconds hold - Yoga Bridge  - 1 x daily - 1 x weekly - 2 sets - 10 reps - 7-10 seconds hold - Standing Hip Hiking  - 5 x daily - 7 x weekly - 1 sets - 5 reps - 5-10 seconds hold -  Standing Scapular Retraction  - 5 x daily - 7 x weekly - 1 sets - 5 reps - 5 second hold - Supine Figure 4 Piriformis Stretch  - 1-2 x daily - 7 x weekly - 1 sets - 5 reps - 20 seconds hold - Clamshell with Resistance  - 2 x daily - 7 x weekly - 1 sets - 15 reps - 3 seconds hold - Standing Shoulder Row with Anchored Resistance  - 1 x daily - 7 x weekly - 1 sets - 20 reps - 3 seconds hold  ASSESSMENT:  CLINICAL IMPRESSION: Paula Richard notes more discomfort today with the rain and poor weather.  She still notes that she is pleased with her progress and that she is feeling better than before starting physical therapy.  We added a hip abductors strengthening exercise today as it weakness and fatigue are contributing to pain with activities later in the day and prolonged weightbearing.  Hip abductors weakness is also noted with other therapeutic activities and Paula Richard will benefit from additional Richard in this area.  OBJECTIVE IMPAIRMENTS: decreased activity tolerance, decreased endurance, decreased knowledge of condition, decreased ROM, decreased strength, decreased safety awareness, improper body mechanics, postural dysfunction, and pain.   ACTIVITY LIMITATIONS: carrying, lifting, bending, sitting, standing, stairs, bed mobility, and locomotion level  PARTICIPATION LIMITATIONS: meal prep, cleaning, laundry, driving, shopping, community activity, and yard Richard  PERSONAL FACTORS: Anxiety, OA back and knees, peripheral neuropathy, Type 2 DM, HTN, trigger finger releases, previous lumbar surgery are also affecting patient's functional outcome.   REHAB POTENTIAL: Good  CLINICAL DECISION MAKING: Evolving/moderate complexity  EVALUATION COMPLEXITY: Moderate   GOALS: Goals reviewed with patient? Yes  SHORT TERM GOALS: Target date: 10/08/2024  Paula Richard will be independent with her day 1 home exercise program Baseline: Started 09/10/2024 Goal status: Met 10/01/2024  2.  Improve lumbar extension AROM  to 15 degrees Baseline: 10 degrees Goal status: Met 10/10/2024  3.  Paula Richard will have a better understanding of correct lifting techniques including golfers lift and diagonal squat lift and will be able to implement these into activities around her house and yard Baseline: Poor body mechanics mentioned and observed at evaluation Goal status: Met 10/10/2024   LONG TERM GOALS: Target date: 11/05/2024  Improve patient-specific functional score to at least  6.5 Baseline: 3.5 Goal status: Ongoing 10/03/2024  2.  Paula Richard will report low back and bilateral gluteal pain no greater than 3/10 on the numeric pain rating scale Baseline: Can be 8/10 Goal status: Ongoing 10/15/2024  3.  Paula Richard will be able to maintain the spine strength test position for a minimum of 30 seconds Baseline: 18 seconds Goal status: Met 10/10/2024  4.  Paula Richard will be independent with her long-term maintenance home exercise program at discharge Baseline: Started 09/10/2024 Goal status: INITIAL  PLAN:  PT FREQUENCY: 1-2x/week  PT DURATION: 8 weeks  PLANNED INTERVENTIONS: 97750- Physical Performance Testing, 97110-Therapeutic exercises, 97530- Therapeutic activity, 97112- Neuromuscular re-education, 97535- Self Care, 02859- Manual therapy, [id number]- Electrical stimulation (unattended), 97012- Traction (mechanical), 20560 (1-2 muscles), 20561 (3+ muscles)- Dry Needling, Patient/Family education, Spinal mobilization, and Cryotherapy.  PLAN FOR NEXT SESSION: Continue to progress low back strengthening for lumbar erector spinae, hip abductors and quadratus lumborum.  Practical posture and body mechanics Richard as Paula Richard is the primary caregiver for her husband with a disability.  Prepare for transfer into independent rehabilitation.  Paula Richard, PT, MPT 10/17/2024, 4:42 PM

## 2024-10-29 NOTE — Progress Notes (Signed)
 CONI HOMESLEY                                          MRN: [id number]   10/29/2024   The [facility] Quality Team Specialist reviewed this patient medical record for the purposes of chart review for care gap closure. The following were reviewed: chart review for care gap closure-kidney health evaluation for diabetes:eGFR  and uACR.    [facility] Quality Team

## 2024-11-06 ENCOUNTER — Encounter: Admitting: Rehabilitative and Restorative Service Providers"

## 2024-11-13 ENCOUNTER — Ambulatory Visit: Admitting: Rehabilitative and Restorative Service Providers"

## 2024-11-13 ENCOUNTER — Encounter: Payer: Self-pay | Admitting: Rehabilitative and Restorative Service Providers"

## 2024-11-13 DIAGNOSIS — M5459 Other low back pain: Secondary | ICD-10-CM | POA: Diagnosis not present

## 2024-11-13 DIAGNOSIS — R293 Abnormal posture: Secondary | ICD-10-CM | POA: Diagnosis not present

## 2024-11-13 DIAGNOSIS — M6281 Muscle weakness (generalized): Secondary | ICD-10-CM | POA: Diagnosis not present

## 2024-11-13 NOTE — Therapy (Addendum)
 " OUTPATIENT PHYSICAL THERAPY THORACOLUMBAR TREATMENT/RE-CERTIFICATION  Patient Name: Paula Richard MRN: [id number] DOB:1951/02/24, 73 y.o., female Today's Date: 11/26/2024  END OF SESSION:      Past Medical History:  Diagnosis Date   Anxiety    Arthritis    back, knees   Cyst of buttocks 02/2018   right   Dental crowns present    FSGS (focal segmental glomerulosclerosis)    states currently in remission   GERD (gastroesophageal reflux disease)    History of Bell's palsy    History of shingles    Hypertension    states under control with med., has been on med. x 30 yr.   Insulin dependent diabetes mellitus    Neuropathy    feet   Seasonal allergies    Past Surgical History:  Procedure Laterality Date   ABDOMINAL HYSTERECTOMY     complete   CARPAL TUNNEL RELEASE Bilateral    FOOT SURGERY Bilateral    MASS EXCISION Right 03/29/2018   Procedure: EXCISION OF RIGHT BUTTOCK CYST;  Surgeon: Paula Cough, MD;  Location: [facility];  Service: General;  Laterality: Right;   NASAL SEPTUM SURGERY     STERIOD INJECTION Left 05/23/2017   Procedure: LEFT TRIGGER FINGER INJECTION;  Surgeon: Paula Kay CHRISTELLA, MD;  Location: [facility];  Service: Orthopedics;  Laterality: Left;   TONSILLECTOMY     TRIGGER FINGER RELEASE Right 05/23/2017   Procedure: RIGHT RING FINGER RELEASE TRIGGER FINGER AND TENOLYSIS, LEFT RING FINGER TRIGGER FINGER INJECTION;  Surgeon: Paula Kay CHRISTELLA, MD;  Location: [facility];  Service: Orthopedics;  Laterality: Right;   TRIGGER FINGER RELEASE Left 12/26/2017   Procedure: RELEASE TRIGGER FINGER LEFT RING FINGER;  Surgeon: Paula Kay CHRISTELLA, MD;  Location: [facility];  Service: Orthopedics;  Laterality: Left;   TRIGGER FINGER RELEASE Left 06/11/2019   Procedure: RELEASE TRIGGER FINGER LEFT MIDDLE FINGER;  Surgeon: Paula Kay CHRISTELLA, MD;  Location: [facility];  Service: Orthopedics;  Laterality: Left;    Patient Active Problem List   Diagnosis Date Noted   Trigger finger, right middle finger 07/25/2022   Stenosing tenosynovitis of finger of right hand 07/25/2022   Extensor tenosynovitis of left wrist 03/05/2019   Trigger finger, left middle finger 03/05/2019   Hamstring tendinitis of right thigh 03/05/2019   Unilateral primary osteoarthritis, left knee 05/08/2017   Trigger finger, left ring finger 05/08/2017   Trigger finger, right ring finger 10/23/2016   Other chest pain 04/14/2015   Type 2 diabetes mellitus without complication 04/14/2015   Hyperlipidemia 04/14/2015   Essential hypertension 04/14/2015   Epigastric pain 04/14/2015   Palpitations 04/14/2015    PCP: Paula Freeman, MD  REFERRING PROVIDER: Gerldine Maizes, MD  REFERRING DIAG:  M51.26 (ICD-10-CM) - Other intervertebral disc displacement, lumbar region    Rationale for Evaluation and Treatment: Rehabilitation  THERAPY DIAG:  Abnormal posture  Muscle weakness (generalized)  Other low back pain  ONSET DATE: 4-5 months ago  SUBJECTIVE:  SUBJECTIVE STATEMENT: Paula Richard notes behind the knee pain on the left side (of about a week).  She has been very busy with her husband's health situation.  Otherwise, she feels better as compared to before starting supervised physical therapy.  PERTINENT HISTORY:  Anxiety, OA back and knees, peripheral neuropathy, Type 2 DM, HTN, trigger finger releases, previous lumbar surgery  PAIN:  Are you having pain? Yes: NPRS scale: 1-2/10 the past week Pain location: Mostly right sided but can be bilateral Pain description: Ache, sore (improving), spasm, can be sharp (every few days) Aggravating factors: Carrying anything, prolonged postures, foul weather and yard work Relieving factors: Lying  down  PRECAUTIONS: Back  RED FLAGS: None   WEIGHT BEARING RESTRICTIONS: No  FALLS:  Has patient fallen in last 6 months? Yes. Number of falls 2, will seek more details visit 2  LIVING ENVIRONMENT: Lives with: lives with their family and lives with their spouse Lives in: House/apartment Stairs: Has some trouble with stairs Has following equipment at home: None  OCCUPATION: Retired, doing everything around the house (husband has a disability)  PLOF: Independent  PATIENT GOALS: Be able to manage household chores without being limited by the back  NEXT MD VISIT: NA  OBJECTIVE:  Note: Objective measures were completed at Evaluation unless otherwise noted.  DIAGNOSTIC FINDINGS:  2021 Pre-surgical MRI: 1. At L5-S1 there is a broad-based disc bulge with a right paracentral disc protrusion which contacts and posteriorly displaces the right S1 nerve root. Mild bilateral foraminal narrowing. 2. At L4-5 there is a broad-based disc osteophyte complex with a small broad central disc protrusion. Mild bilateral facet arthropathy. Bilateral subarticular recess stenosis. Mild spinal stenosis. Mild bilateral foraminal narrowing. 3. Lumbar spine spondylosis as described above.  PATIENT SURVEYS:  PSFS: THE PATIENT SPECIFIC FUNCTIONAL SCALE  Place score of 0-10 (0 = unable to perform activity and 10 = able to perform activity at the same level as before injury or problem)  Activity Date: 09/10/2024 10/03/2024 11/13/2024  Yard work 3 4 5   2.  Take care of outside animals 7 8 8   3.  Sitting 2 5 9   4.  Standing 2 7 7   Total Score 3.5 6 7.25    Total Score = Sum of activity scores/number of activities  Minimally Detectable Change: 3 points (for single activity); 2 points (for average score)  [facility] (nd). The Patient Specific Functional Scale . Retrieved from [email or web address]   COGNITION: Overall cognitive  status: Within functional limits for tasks assessed     SENSATION: Paula Richard notes no peripheral pain or paresthesias below the gluteal folds.  She has low back and at times gluteal pain.  MUSCLE LENGTH: 10/15/2024: Hamstrings: Right 55 deg; Left 55 deg  10/03/2024: Hamstrings: Right 50 deg; Left 45 deg  Evaluation: Hamstrings: Right 45 deg; Left 45 deg  POSTURE: Good posture when standing and Paula Richard understands the importance of good posture.  However, her body mechanics as described with yard work and other activities around the house will need to improve.  LUMBAR ROM:   AROM 09/10/2024 10/10/2024  Flexion    Extension 10 15  Right lateral flexion    Left lateral flexion    Right rotation    Left rotation     (Blank rows = not tested)  LOWER EXTREMITY ROM:     Passive  Left/Right 09/10/2024 Left/Right 10/03/2024 Left/Right 10/15/2024  Hip flexion 90/90 95/100 100/100  Hip extension     Hip abduction  Hip adduction     Hip internal rotation 3/14 12/12 14/12  Hip external rotation 38/36 36/38 34/28   Knee flexion     Knee extension     Ankle dorsiflexion     Ankle plantarflexion     Ankle inversion     Ankle eversion      (Blank rows = not tested)  STRENGTH:     Left/Right 09/10/2024 Left/Right 10/10/2024 Left/Right 12//2025  Hip flexion     Hip extension     Hip abduction     Hip adduction     Hip internal rotation     Hip external rotation     Knee flexion     Knee extension     Ankle dorsiflexion     Ankle plantarflexion     Ankle inversion     Ankle eversion     Lumbar extension (prone bilateral arm & leg extension test) 18 seconds (Goal is 30-60) 40 seconds    (Blank rows = not tested)  GAIT: Distance walked: 100 feet Assistive device utilized: None Level of assistance: Complete Independence Comments: Henreitta notes she used to walk for exercise but has not done so in quite some time  TREATMENT DATE:  11/13/2024 Standing lumbar extension AROM  5 x 3 seconds (emphasis on hips forward, hands on lower gluteals and staying within the comfortable range of motion)  Yoga Bridge 10 x 5 seconds Figure 4 stretch 4 x 20 seconds each side Supine clams and Blue Thera-Band 15 x 3 seconds, slow eccentrics  Functional Activities: Hip hike in doorframe 2 sets 5 for 3 seconds (posture is better) Hip hike, IR and press (needed too much feedback, not ready for HEP) Scapular retraction/shoulder blade pinches 10 x 5 seconds (posture)  Practical body mechanics for golfers and diagonal squat lifts for 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  Not charged: Epley for the right sided lesion and education to (for the next week) avoid sleeping on the right side, avoid too much head up or down and recommendations to sleep on her back   10/17/2024 Standing lumbar extension AROM 10 x 3 seconds (emphasis on hips forward, hands on lower gluteals and staying within the comfortable range of motion)  Prone alternating hip extension 10 for 5 seconds Prone alternating arm and leg extensions 10 x 3 seconds (hold next visit, vertigo) Figure 4 stretch 4 x 20 seconds each side Side-lie left and lift right clams and Black Thera-Band 15 x 3 seconds, slow eccentrics  Functional Activities: Hip hike in doorframe 2 sets of 5 for 3 seconds (posture is better) Hip hike, IR and press 5 x each side 3 seconds (needs work to avoid knee hyper-extension, not ready for HEP) Scapular retraction/shoulder blade pinches 10 x 5 seconds (posture) Thera-Band rows/pull to chest for postural correction blue 20 x 3 seconds Thera-Band shoulder extension with palms up for scapular positioning and posture Red 10 x 3 seconds Thera-Band shoulder ER Red 10 x 3 seconds, slow eccentrics each side (posture/scapular retraction Practical body mechanics for golfers and 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; discussed other farm related chores like raking     10/15/2024 Standing lumbar extension AROM 5 x 3 seconds (emphasis on hips forward, hands on lower gluteals and staying within the comfortable range of motion)  Prone alternating hip extension 10 for 5 seconds Prone alternating arm and leg extensions 2 sets of 10 x 3 seconds Figure 4 stretch 4 x 20 seconds  each side  Functional Activities: Hip hike in doorframe 2 sets of 5 for 3 seconds (posture is better) Hip hike, IR and press 5 x each side 3 seconds (needs work to avoid knee hyper-extension, not ready for HEP) Scapular retraction/shoulder blade pinches 5 x 5 seconds (posture) Thera-Band rows/pull to chest for postural correction blue 20 x 3 seconds Thera-Band shoulder extension with palms up for scapular positioning and posture Red 10 x 3 seconds Practical body mechanics for golfers and 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; discussed other farm related chores like raking   [id number]: Reviewed today's objective reassessment    PATIENT EDUCATION:  Education details: See above Person educated: Patient Education method: Explanation, Demonstration, Tactile cues, Verbal cues, and Handouts Education comprehension: verbalized understanding, returned demonstration, verbal cues required, tactile cues required, and needs further education  HOME EXERCISE PROGRAM: Access Code: [email or web address] URL: [email or web address] Date: 10/17/2024 Prepared by: Lamar Ivory  Exercises - Standing Lumbar Extension at Wall - Forearms  - 5 x daily - 7 x weekly - 1 sets - 5 reps - 3 seconds hold - Prone Hip Extension  - 1 x daily - 7 x weekly - 2 sets - 10 reps - 5-10 seconds hold - Yoga Bridge  - 1 x daily - 1 x weekly - 2 sets - 10 reps - 7-10 seconds hold - Standing Hip Hiking  - 5 x daily - 7 x weekly - 1 sets - 5 reps - 5-10 seconds hold - Standing Scapular Retraction  - 5 x daily - 7 x weekly - 1 sets - 5 reps - 5 second hold - Supine Figure 4 Piriformis Stretch  - 1-2 x  daily - 7 x weekly - 1 sets - 5 reps - 20 seconds hold - Clamshell with Resistance  - 2 x daily - 7 x weekly - 1 sets - 15 reps - 3 seconds hold - Standing Shoulder Row with Anchored Resistance  - 1 x daily - 7 x weekly - 1 sets - 20 reps - 3 seconds hold  ASSESSMENT:  CLINICAL IMPRESSION: Pavielle notes overall progress with her pain and function since starting physical therapy.  She brought her granddaughter to drive her home as I told her I would perform an Epley maneuver to address BPPV if she had a driver.  I reviewed post Epley recommendations as listed above and encouraged her to continue her current rehabilitation program with a strength and functional reassessment on her next visit.  Weakness and fatigue still are contributing to pain with activities later in the day and prolonged weight-bearing.    OBJECTIVE IMPAIRMENTS: decreased activity tolerance, decreased endurance, decreased knowledge of condition, decreased ROM, decreased strength, decreased safety awareness, improper body mechanics, postural dysfunction, and pain.   ACTIVITY LIMITATIONS: carrying, lifting, bending, sitting, standing, stairs, bed mobility, and locomotion level  PARTICIPATION LIMITATIONS: meal prep, cleaning, laundry, driving, shopping, community activity, and yard work  PERSONAL FACTORS: Anxiety, OA back and knees, peripheral neuropathy, Type 2 DM, HTN, trigger finger releases, previous lumbar surgery are also affecting patient's functional outcome.   REHAB POTENTIAL: Good  CLINICAL DECISION MAKING: Evolving/moderate complexity  EVALUATION COMPLEXITY: Moderate   GOALS: Goals reviewed with patient? Yes  SHORT TERM GOALS: Target date: 10/08/2024  Nikie will be independent with her day 1 home exercise program Baseline: Started 09/10/2024 Goal status: Met 10/01/2024  2.  Improve lumbar extension AROM to 15 degrees Baseline: 10 degrees Goal status: Met 10/10/2024  3.  [employer]  will have a better  understanding of correct lifting techniques including golfers lift and diagonal squat lift and will be able to implement these into activities around her house and yard Baseline: Poor body mechanics mentioned and observed at evaluation Goal status: Met 10/10/2024   LONG TERM GOALS: Target date: 11/05/2024  Improve patient-specific functional score to at least 6.5 Baseline: 3.5 Goal status: Met 11/13/2024  2.  Nitara will report low back and bilateral gluteal pain no greater than 3/10 on the numeric pain rating scale Baseline: Can be 8/10 Goal status: Mostly met 11/13/2024  3.  Orlanda will be able to maintain the spine strength test position for a minimum of 30 seconds Baseline: 18 seconds Goal status: Met 10/10/2024  4.  Atalie will be independent with her long-term maintenance home exercise program at discharge Baseline: Started 09/10/2024 Goal status: INITIAL  PLAN:  PT FREQUENCY: 1-2x/week  PT DURATION: 8 weeks  PLANNED INTERVENTIONS: 97750- Physical Performance Testing, 97110-Therapeutic exercises, 97530- Therapeutic activity, 97112- Neuromuscular re-education, 97535- Self Care, [id number]- Manual therapy, [id number]- Electrical stimulation (unattended), 97012- Traction (mechanical), 20560 (1-2 muscles), 20561 (3+ muscles)- Dry Needling, Patient/Family education, Spinal mobilization, and Cryotherapy.  PLAN FOR NEXT SESSION: Continue to progress low back strengthening for lumbar erector spinae, hip abductors and quadratus lumborum.  Practical posture and body mechanics work as Khristy is the primary caregiver for her husband with a disability.  Objective measures, patient-specific functional score and prepare for transfer into independent rehabilitation.  Follow-up from Epley last visit.  Myer LELON Ivory, PT, MPT 11/26/2024, 5:58 PM    "

## 2024-11-26 ENCOUNTER — Ambulatory Visit: Admitting: Rehabilitative and Restorative Service Providers"

## 2024-11-26 ENCOUNTER — Encounter: Payer: Self-pay | Admitting: Rehabilitative and Restorative Service Providers"

## 2024-11-26 DIAGNOSIS — R293 Abnormal posture: Secondary | ICD-10-CM

## 2024-11-26 DIAGNOSIS — M6281 Muscle weakness (generalized): Secondary | ICD-10-CM

## 2024-11-26 DIAGNOSIS — M5459 Other low back pain: Secondary | ICD-10-CM

## 2024-11-26 NOTE — Therapy (Signed)
 " OUTPATIENT PHYSICAL THERAPY THORACOLUMBAR TREATMENT/DISCHARGE  PHYSICAL THERAPY DISCHARGE SUMMARY  Visits from Start of Care: 10  Current functional level related to goals / functional outcomes: See note   Remaining deficits: See note   Education / Equipment: Updated home exercise program  Patient agrees to discharge. Patient goals were largely met. Patient is being discharged due to being pleased with her current functional level and responsibilities with a sick loved one.   Patient Name: Paula Richard MRN: [id number] DOB:1951/09/07, 73 y.o., female Today's Date: 11/26/2024  END OF SESSION:  PT End of Session - 11/26/24 1152     Visit Number 10    Number of Visits 16    Date for Recertification  11/26/24 (P)     PT Start Time 1149    PT Stop Time 1230    PT Time Calculation (min) 41 min    Activity Tolerance Patient tolerated treatment well;No increased pain    Behavior During Therapy WFL for tasks assessed/performed            Past Medical History:  Diagnosis Date   Anxiety    Arthritis    back, knees   Cyst of buttocks 02/2018   right   Dental crowns present    FSGS (focal segmental glomerulosclerosis)    states currently in remission   GERD (gastroesophageal reflux disease)    History of Bell's palsy    History of shingles    Hypertension    states under control with med., has been on med. x 30 yr.   Insulin dependent diabetes mellitus    Neuropathy    feet   Seasonal allergies    Past Surgical History:  Procedure Laterality Date   ABDOMINAL HYSTERECTOMY     complete   CARPAL TUNNEL RELEASE Bilateral    FOOT SURGERY Bilateral    MASS EXCISION Right 03/29/2018   Procedure: EXCISION OF RIGHT BUTTOCK CYST;  Surgeon: Gladis Cough, MD;  Location: [facility];  Service: General;  Laterality: Right;   NASAL SEPTUM SURGERY     STERIOD INJECTION Left 05/23/2017   Procedure: LEFT TRIGGER FINGER INJECTION;  Surgeon: Jerri Kay CHRISTELLA, MD;   Location: [facility];  Service: Orthopedics;  Laterality: Left;   TONSILLECTOMY     TRIGGER FINGER RELEASE Right 05/23/2017   Procedure: RIGHT RING FINGER RELEASE TRIGGER FINGER AND TENOLYSIS, LEFT RING FINGER TRIGGER FINGER INJECTION;  Surgeon: Jerri Kay CHRISTELLA, MD;  Location: [facility];  Service: Orthopedics;  Laterality: Right;   TRIGGER FINGER RELEASE Left 12/26/2017   Procedure: RELEASE TRIGGER FINGER LEFT RING FINGER;  Surgeon: Jerri Kay CHRISTELLA, MD;  Location: [facility];  Service: Orthopedics;  Laterality: Left;   TRIGGER FINGER RELEASE Left 06/11/2019   Procedure: RELEASE TRIGGER FINGER LEFT MIDDLE FINGER;  Surgeon: Jerri Kay CHRISTELLA, MD;  Location: [facility];  Service: Orthopedics;  Laterality: Left;   Patient Active Problem List   Diagnosis Date Noted   Trigger finger, right middle finger 07/25/2022   Stenosing tenosynovitis of finger of right hand 07/25/2022   Extensor tenosynovitis of left wrist 03/05/2019   Trigger finger, left middle finger 03/05/2019   Hamstring tendinitis of right thigh 03/05/2019   Unilateral primary osteoarthritis, left knee 05/08/2017   Trigger finger, left ring finger 05/08/2017   Trigger finger, right ring finger 10/23/2016   Other chest pain 04/14/2015   Type 2 diabetes mellitus without complication 04/14/2015   Hyperlipidemia 04/14/2015   Essential  hypertension 04/14/2015   Epigastric pain 04/14/2015   Palpitations 04/14/2015    PCP: Glendia Freeman, MD  REFERRING PROVIDER: Gerldine Maizes, MD  REFERRING DIAG:  M51.26 (ICD-10-CM) - Other intervertebral disc displacement, lumbar region    Rationale for Evaluation and Treatment: Rehabilitation  THERAPY DIAG:  Abnormal posture  Muscle weakness (generalized)  Other low back pain  ONSET DATE: 4-5 months ago  SUBJECTIVE:                                                                                                                                                                                            SUBJECTIVE STATEMENT: Paula Richard notes progress as compared to before starting supervised physical therapy.  Remaining impairments are being addressed with her home exercise program and she was encouraged to contact me if she would like to return for reassessment or additional supervised physical therapy.  PERTINENT HISTORY:  Anxiety, OA back and knees, peripheral neuropathy, Type 2 DM, HTN, trigger finger releases, previous lumbar surgery  PAIN:  Are you having pain? Yes: NPRS scale: Low back 1--2/10 the past week, a 1 x 7/10 with the posterior left knee Pain location: Mostly right sided but can be bilateral Pain description: Ache, sore (improving), no more spasm Aggravating factors: Prolonged postures, foul weather and yard work Relieving factors: Lying down, participating with her home exercise program  PRECAUTIONS: Back  RED FLAGS: None   WEIGHT BEARING RESTRICTIONS: No  FALLS:  Has patient fallen in last 6 months? Yes. Number of falls 2, will seek more details visit 2  LIVING ENVIRONMENT: Lives with: lives with their family and lives with their spouse Lives in: House/apartment Stairs: Has some trouble with stairs Has following equipment at home: None  OCCUPATION: Retired, doing everything around the house (husband has a disability)  PLOF: Independent  PATIENT GOALS: Be able to manage household chores without being limited by the back  NEXT MD VISIT: NA  OBJECTIVE:  Note: Objective measures were completed at Evaluation unless otherwise noted.  DIAGNOSTIC FINDINGS:  2021 Pre-surgical MRI: 1. At L5-S1 there is a broad-based disc bulge with a right paracentral disc protrusion which contacts and posteriorly displaces the right S1 nerve root. Mild bilateral foraminal narrowing. 2. At L4-5 there is a broad-based disc osteophyte complex with a small broad central disc protrusion. Mild bilateral  facet arthropathy. Bilateral subarticular recess stenosis. Mild spinal stenosis. Mild bilateral foraminal narrowing. 3. Lumbar spine spondylosis as described above.  PATIENT SURVEYS:  PSFS: THE PATIENT SPECIFIC FUNCTIONAL SCALE  Place score of 0-10 (0 = unable to perform activity and 10 = able to perform activity at the same  level as before injury or problem)  Activity Date: 09/10/2024 10/03/2024 11/13/2024  Yard work 3 4 5   2.  Take care of outside animals 7 8 8   3.  Sitting 2 5 9   4.  Standing 2 7 7   Total Score 3.5 6 7.25    Total Score = Sum of activity scores/number of activities  Minimally Detectable Change: 3 points (for single activity); 2 points (for average score)  [facility] (nd). The Patient Specific Functional Scale . Retrieved from [email or web address]   COGNITION: Overall cognitive status: Within functional limits for tasks assessed     SENSATION: Paula Richard notes no peripheral pain or paresthesias below the gluteal folds.  She has low back and at times gluteal pain.  MUSCLE LENGTH: 10/15/2024: Hamstrings: Right 55 deg; Left 55 deg  10/03/2024: Hamstrings: Right 50 deg; Left 45 deg  Evaluation: Hamstrings: Right 45 deg; Left 45 deg  POSTURE: Good posture when standing and Paula Richard understands the importance of good posture.  However, her body mechanics as described with yard work and other activities around the house will need to improve.  LUMBAR ROM:   AROM 09/10/2024 10/10/2024  Flexion    Extension 10 15  Right lateral flexion    Left lateral flexion    Right rotation    Left rotation     (Blank rows = not tested)  LOWER EXTREMITY ROM:     Passive  Left/Right 09/10/2024 Left/Right 10/03/2024 Left/Right 10/15/2024 Left/Right 11/26/2024  Hip flexion 90/90 95/100 100/100 100/100  Hip extension      Hip abduction      Hip adduction      Hip internal rotation 3/14 12/12 14/12 10/18   Hip  external rotation 38/36 36/38 34/28  32/40  Knee flexion      Knee extension      Ankle dorsiflexion      Ankle plantarflexion      Ankle inversion      Ankle eversion      Hamstrings    50/50   (Blank rows = not tested)  STRENGTH:     Left/Right 09/10/2024 Left/Right 10/10/2024 Left/Right 11/26/2024  Hip flexion     Hip extension     Hip abduction     Hip adduction     Hip internal rotation     Hip external rotation     Knee flexion     Knee extension     Ankle dorsiflexion     Ankle plantarflexion     Ankle inversion     Ankle eversion     Lumbar extension (prone bilateral arm & leg extension test) 18 seconds (Goal is 30-60) 40 seconds 60 seconds   (Blank rows = not tested)  GAIT: Distance walked: 100 feet Assistive device utilized: None Level of assistance: Complete Independence Comments: Paula Richard notes she used to walk for exercise but has not done so in quite some time  TREATMENT DATE:  11/26/2024 Standing lumbar extension AROM 5 x 3 seconds (emphasis on hips forward, hands on lower gluteals and staying within the comfortable range of motion)  Yoga Bridge 10 x 5 seconds Figure 4 stretch 4 x 20 seconds each side Supine clams and Blue Thera-Band 15 x 3 seconds, slow eccentrics  Functional Activities: Hip hike in doorframe 2 sets 5 for 3 seconds (posture is much better) Hip hike, IR and press (Ready for HEP) Scapular retraction/shoulder blade pinches 10 x 5 seconds (posture) Practical body mechanics for golfers and diagonal squat lifts for managing  the animals and other farm related chores; reviewed [facility] instructions and encouraged follow-up for additional physical therapy if her current progress does not continue   11/13/2024 Standing lumbar extension AROM 5 x 3 seconds (emphasis on hips forward, hands on lower gluteals and staying within the comfortable range of motion)  Yoga Bridge 10 x 5 seconds Figure 4 stretch 4 x 20 seconds each side Supine clams and Blue  Thera-Band 15 x 3 seconds, slow eccentrics  Functional Activities: Hip hike in doorframe 2 sets 5 for 3 seconds (posture is better) Hip hike, IR and press (needed too much feedback, not ready for HEP) Scapular retraction/shoulder blade pinches 10 x 5 seconds (posture)  Practical body mechanics for golfers and diagonal squat lifts for 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  Not charged: [facility] for the right sided lesion and education to (for the next week) avoid sleeping on the right side, avoid too much head up or down and recommendations to sleep on her back   10/17/2024 Standing lumbar extension AROM 10 x 3 seconds (emphasis on hips forward, hands on lower gluteals and staying within the comfortable range of motion)  Prone alternating hip extension 10 for 5 seconds Prone alternating arm and leg extensions 10 x 3 seconds (hold next visit, vertigo) Figure 4 stretch 4 x 20 seconds each side Side-lie left and lift right clams and Black Thera-Band 15 x 3 seconds, slow eccentrics  Functional Activities: Hip hike in doorframe 2 sets of 5 for 3 seconds (posture is better) Hip hike, IR and press 5 x each side 3 seconds (needs work to avoid knee hyper-extension, not ready for HEP) Scapular retraction/shoulder blade pinches 10 x 5 seconds (posture) Thera-Band rows/pull to chest for postural correction blue 20 x 3 seconds Thera-Band shoulder extension with palms up for scapular positioning and posture Red 10 x 3 seconds Thera-Band shoulder ER Red 10 x 3 seconds, slow eccentrics each side (posture/scapular retraction Practical body mechanics for golfers and 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; discussed other farm related chores like raking    PATIENT EDUCATION:  Education details: See above Person educated: Patient Education method: Programmer, Multimedia, Facilities Manager, Actor cues, Verbal cues, and Handouts Education comprehension: verbalized understanding, returned  demonstration, verbal cues required, tactile cues required, and needs further education  HOME EXERCISE PROGRAM: Access Code: [email or web address] URL: [email or web address] Date: 11/26/2024 Prepared by: Lamar Ivory  Exercises - Standing Lumbar Extension at Wall - Forearms  - 5 x daily - 7 x weekly - 1 sets - 3 reps - 3 seconds hold - Prone Hip Extension  - 1 x daily - 1 x weekly - 2 sets - 10 reps - 5-10 seconds hold - Yoga Bridge  - 1 x daily - 7 x weekly - 2 sets - 10 reps - 7-10 seconds hold - Standing Hip Hiking  - 1 x daily - 7 x weekly - 2 sets - 5 reps - 5-10 seconds hold - Standing Scapular Retraction  - 5 x daily - 7 x weekly - 1 sets - 2-3 reps - 5 second hold - Supine Figure 4 Piriformis Stretch  - 1 x daily - 7 x weekly - 1 sets - 5 reps - 20 seconds hold - Standing Shoulder Row with Anchored Resistance  - 1 x daily - 1 x weekly - 1 sets - 20 reps - 3 seconds hold - Hooklying Clamshell with Resistance  - 1 x daily - 7 x weekly - 2  sets - 10 reps - 3 second hold  ASSESSMENT:  CLINICAL IMPRESSION: Paula Richard notes continued progress with her pain and function since starting physical therapy.  Some posterior knee pain appears to be related to knee hyperextension and we reviewed the importance of avoiding this along with some of her current exercises that are addressing quadriceps strength that should also address this.  Paula Richard has met most long-term goals and feels comfortable with her home exercise program.  She appears ready for transition into more independent rehabilitation.  OBJECTIVE IMPAIRMENTS: decreased activity tolerance, decreased endurance, decreased knowledge of condition, decreased ROM, decreased strength, decreased safety awareness, improper body mechanics, postural dysfunction, and pain.   ACTIVITY LIMITATIONS: carrying, lifting, bending, sitting, standing, stairs, bed mobility, and locomotion level  PARTICIPATION LIMITATIONS: meal prep, cleaning, laundry,  driving, shopping, community activity, and yard work  PERSONAL FACTORS: Anxiety, OA back and knees, peripheral neuropathy, Type 2 DM, HTN, trigger finger releases, previous lumbar surgery are also affecting patient's functional outcome.   REHAB POTENTIAL: Good  CLINICAL DECISION MAKING: Evolving/moderate complexity  EVALUATION COMPLEXITY: Moderate   GOALS: Goals reviewed with patient? Yes  SHORT TERM GOALS: Target date: 10/08/2024  Paula Richard will be independent with her day 1 home exercise program Baseline: Started 09/10/2024 Goal status: Met 10/01/2024  2.  Improve lumbar extension AROM to 15 degrees Baseline: 10 degrees Goal status: Met 10/10/2024  3.  Paula Richard will have a better understanding of correct lifting techniques including golfers lift and diagonal squat lift and will be able to implement these into activities around her house and yard Baseline: Poor body mechanics mentioned and observed at evaluation Goal status: Met 10/10/2024   LONG TERM GOALS: Target date: 11/26/2024  Improve patient-specific functional score to at least 6.5 Baseline: 3.5 Goal status: Met 11/13/2024  2.  Paula Richard will report low back and bilateral gluteal pain no greater than 3/10 on the numeric pain rating scale Baseline: Can be 8/10 Goal status: Met 11/26/2024  3.  Paula Richard will be able to maintain the spine strength test position for a minimum of 30 seconds Baseline: 18 seconds Goal status: Met 10/10/2024  4.  Paula Richard will be independent with her long-term maintenance home exercise program at discharge Baseline: Started 09/10/2024 Goal status: Met 11/26/2024  PLAN:  PT FREQUENCY: DC  PT DURATION: DC  PLANNED INTERVENTIONS: 97750- Physical Performance Testing, 97110-Therapeutic exercises, 97530- Therapeutic activity, 97112- Neuromuscular re-education, 97535- Self Care, [id number]- Manual therapy, [id number]- Electrical stimulation (unattended), 02987- Traction (mechanical), 20560 (1-2  muscles), 20561 (3+ muscles)- Dry Needling, Patient/Family education, Spinal mobilization, and Cryotherapy.  PLAN FOR NEXT SESSION: DC  Myer LELON Ivory, PT, MPT 11/26/2024, 5:57 PM    "

## 2024-11-26 NOTE — Addendum Note (Signed)
 Addended by: FARRELL LAMAR ORN on: 11/26/2024 06:01 PM   Modules accepted: Orders
